# Patient Record
Sex: Female | Born: 1985 | Race: Black or African American | Hispanic: No | Smoking: Current every day smoker
Health system: Southern US, Community
[De-identification: ages and names within clinical notes are randomized; demographics above are authoritative.]

## PROBLEM LIST (undated history)

## (undated) ENCOUNTER — Inpatient Hospital Stay (HOSPITAL_COMMUNITY): Payer: Self-pay

## (undated) DIAGNOSIS — F329 Major depressive disorder, single episode, unspecified: Secondary | ICD-10-CM

## (undated) HISTORY — PX: NO PAST SURGERIES: SHX2092

---
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
## 2005-10-23 DIAGNOSIS — F32A Depression, unspecified: Secondary | ICD-10-CM

## 2005-10-23 HISTORY — DX: Depression, unspecified: F32.A

## 2008-09-02 ENCOUNTER — Emergency Department (HOSPITAL_COMMUNITY): Admission: EM | Admit: 2008-09-02 | Discharge: 2008-09-02 | Payer: Self-pay | Admitting: Emergency Medicine

## 2009-01-07 ENCOUNTER — Emergency Department (HOSPITAL_COMMUNITY): Admission: EM | Admit: 2009-01-07 | Discharge: 2009-01-07 | Payer: Self-pay | Admitting: Emergency Medicine

## 2009-11-12 ENCOUNTER — Emergency Department (HOSPITAL_COMMUNITY): Admission: EM | Admit: 2009-11-12 | Discharge: 2009-11-13 | Payer: Self-pay | Admitting: Emergency Medicine

## 2010-06-13 ENCOUNTER — Inpatient Hospital Stay (HOSPITAL_COMMUNITY)
Admission: AD | Admit: 2010-06-13 | Discharge: 2010-06-13 | Payer: Self-pay | Source: Home / Self Care | Admitting: Family Medicine

## 2010-08-09 ENCOUNTER — Emergency Department (HOSPITAL_COMMUNITY): Admission: EM | Admit: 2010-08-09 | Discharge: 2010-08-09 | Payer: Self-pay | Admitting: Emergency Medicine

## 2010-10-11 ENCOUNTER — Emergency Department (HOSPITAL_COMMUNITY)
Admission: EM | Admit: 2010-10-11 | Discharge: 2010-10-11 | Payer: Self-pay | Source: Home / Self Care | Admitting: Family Medicine

## 2011-01-02 LAB — POCT I-STAT, CHEM 8
BUN: 8 mg/dL (ref 6–23)
Calcium, Ion: 1.15 mmol/L (ref 1.12–1.32)
Chloride: 105 mEq/L (ref 96–112)
Creatinine, Ser: 0.9 mg/dL (ref 0.4–1.2)
Glucose, Bld: 75 mg/dL (ref 70–99)
TCO2: 26 mmol/L (ref 0–100)

## 2011-01-09 LAB — DIFFERENTIAL
Basophils Absolute: 0 10*3/uL (ref 0.0–0.1)
Basophils Relative: 0 % (ref 0–1)
Eosinophils Absolute: 0.1 10*3/uL (ref 0.0–0.7)
Monocytes Relative: 5 % (ref 3–12)
Neutrophils Relative %: 89 % — ABNORMAL HIGH (ref 43–77)

## 2011-01-09 LAB — BASIC METABOLIC PANEL
BUN: 12 mg/dL (ref 6–23)
CO2: 27 mEq/L (ref 19–32)
Calcium: 9.7 mg/dL (ref 8.4–10.5)
Chloride: 101 mEq/L (ref 96–112)
Creatinine, Ser: 0.87 mg/dL (ref 0.4–1.2)
Glucose, Bld: 101 mg/dL — ABNORMAL HIGH (ref 70–99)

## 2011-01-09 LAB — URINE MICROSCOPIC-ADD ON

## 2011-01-09 LAB — CBC
MCHC: 32.5 g/dL (ref 30.0–36.0)
MCV: 87.5 fL (ref 78.0–100.0)
Platelets: 163 10*3/uL (ref 150–400)
RBC: 5.16 MIL/uL — ABNORMAL HIGH (ref 3.87–5.11)
RDW: 14.6 % (ref 11.5–15.5)

## 2011-01-09 LAB — URINALYSIS, ROUTINE W REFLEX MICROSCOPIC
Glucose, UA: NEGATIVE mg/dL
Ketones, ur: NEGATIVE mg/dL
Leukocytes, UA: NEGATIVE
Protein, ur: 30 mg/dL — AB
Urobilinogen, UA: 0.2 mg/dL (ref 0.0–1.0)

## 2011-01-25 ENCOUNTER — Emergency Department (HOSPITAL_COMMUNITY)
Admission: EM | Admit: 2011-01-25 | Discharge: 2011-01-25 | Disposition: A | Discharge status: Home / Self Care | Payer: Self-pay | Attending: Emergency Medicine | Admitting: Emergency Medicine

## 2011-01-25 DIAGNOSIS — R45851 Suicidal ideations: Secondary | ICD-10-CM | POA: Insufficient documentation

## 2011-01-25 DIAGNOSIS — H5501 Congenital nystagmus: Secondary | ICD-10-CM | POA: Insufficient documentation

## 2011-01-25 DIAGNOSIS — H5316 Psychophysical visual disturbances: Secondary | ICD-10-CM | POA: Insufficient documentation

## 2011-01-25 DIAGNOSIS — F329 Major depressive disorder, single episode, unspecified: Secondary | ICD-10-CM

## 2011-01-25 LAB — URINE MICROSCOPIC-ADD ON

## 2011-01-25 LAB — RAPID URINE DRUG SCREEN, HOSP PERFORMED
Amphetamines: NOT DETECTED
Benzodiazepines: NOT DETECTED
Cocaine: NOT DETECTED
Tetrahydrocannabinol: POSITIVE — AB

## 2011-01-25 LAB — CBC
HCT: 38.8 % (ref 36.0–46.0)
MCV: 85.5 fL (ref 78.0–100.0)
RBC: 4.54 MIL/uL (ref 3.87–5.11)
WBC: 4.6 10*3/uL (ref 4.0–10.5)

## 2011-01-25 LAB — URINALYSIS, ROUTINE W REFLEX MICROSCOPIC: Bilirubin Urine: NEGATIVE

## 2011-01-25 LAB — COMPREHENSIVE METABOLIC PANEL
ALT: 11 U/L (ref 0–35)
AST: 15 U/L (ref 0–37)
CO2: 26 mEq/L (ref 19–32)
Calcium: 9.1 mg/dL (ref 8.4–10.5)
Creatinine, Ser: 0.8 mg/dL (ref 0.4–1.2)
GFR calc Af Amer: >60 mL/min (ref >60)
GFR calc non Af Amer: >60 mL/min (ref >60)
Sodium: 136 mEq/L (ref 135–145)
Total Protein: 6.9 g/dL (ref 6.0–8.3)

## 2011-01-25 LAB — DIFFERENTIAL
Eosinophils Relative: 3 % (ref 0–5)
Lymphocytes Relative: 41 % (ref 12–46)
Lymphs Abs: 1.9 10*3/uL (ref 0.7–4.0)
Neutrophils Relative %: 50 % (ref 43–77)

## 2011-01-25 LAB — ETHANOL: Alcohol, Ethyl (B): <5 mg/dL (ref 0–10)

## 2011-01-26 NOTE — Consult Note (Signed)
  NAMEAUBRIEE, Vicki Tran                ACCOUNT NO.:  [id number]  MEDICAL RECORD NO.:  [id number]           PATIENT TYPE:  E  LOCATION:  WLED                         FACILITY:  [facility]  PHYSICIAN:  Eulogio Ditch, MD DATE OF BIRTH:  June 09, 1986  DATE OF CONSULTATION:  01/25/2011 DATE OF DISCHARGE:                                CONSULTATION   FACILITY:  [facility].  REASON FOR CONSULTATION:  Depression and suicidal ideations.  HISTORY OF PRESENT ILLNESS:  This 25 year old African-American female who came to [facility] to get help for her depression.  The patient was seen with ACT team member, [name].  The patient told us that she is not suicidal, she do not want to kill her but she feels depressed and she came here by herself to get help in the outpatient setting for the depression.  The patient also is abusing alcohol and marijuana, and she wants counseling for that but in the outpatient setting.  The patient is very logical and goal directed during the interview, not internally preoccupied.  No flight of ideas present.  Denies hearing any voices.  SOCIAL HISTORY:  The patient lives with her friend.  She is not working, not going to school.  The patient also regularly in touch with the mother.  The patient is alert, awake, oriented x3.  Memory immediate, recent, and remote intact.  Fund of knowledge fair.  Attention and concentration good.  Abstraction ability good.  Insight and judgment intact.  DIAGNOSES:  AXIS I:  Depressive disorder, NOS; rule out major depressive disorder, recurrent type; alcohol and marijuana abuse/dependence. AXIS II:  Deferred. AXIS III:  No active medical issue. AXIS IV:  Chronic substance abuse. AXIS V:  50 to 60.  RECOMMENDATIONS:  The patient at this time does not meet criteria to be admitted on IVC to the inpatient.  The patient has not having any active suicidal ideation, she reported that she has suicidal ideation in the past.  The  patient never been admitted to psych hospital in the past. No history of recent suicide attempt, she has one suicide attempt by overdose in her teens but not recently in last few years that she tried to kill herself.  The patient was given an option to be admitted to behavioral health, but patient is adamant about following outpatient setting and do not want to be admitted in the hospital.  As patient not meeting criteria to be admitted in the hospital IVC, patient can be discharged, to follow up in the outpatient setting.     Eulogio Ditch, MD     SA/MEDQ  D:  01/25/2011  T:  01/25/2011  Job:  [id number]  Electronically Signed by Eulogio Ditch  on 01/26/2011 08:42:27 AM

## 2011-05-01 ENCOUNTER — Emergency Department (HOSPITAL_COMMUNITY)
Admission: EM | Admit: 2011-05-01 | Discharge: 2011-05-01 | Disposition: A | Discharge status: Home / Self Care | Payer: Self-pay | Attending: Emergency Medicine | Admitting: Emergency Medicine

## 2011-05-01 DIAGNOSIS — R1084 Generalized abdominal pain: Secondary | ICD-10-CM | POA: Insufficient documentation

## 2011-05-01 DIAGNOSIS — F1210 Cannabis abuse, uncomplicated: Secondary | ICD-10-CM | POA: Insufficient documentation

## 2011-05-01 DIAGNOSIS — Z0000 Encounter for general adult medical examination without abnormal findings: Secondary | ICD-10-CM | POA: Insufficient documentation

## 2011-05-01 DIAGNOSIS — R51 Headache: Secondary | ICD-10-CM | POA: Insufficient documentation

## 2011-05-01 LAB — POCT PREGNANCY, URINE: Preg Test, Ur: NEGATIVE

## 2011-06-01 ENCOUNTER — Ambulatory Visit: Payer: Self-pay | Admitting: Obstetrics and Gynecology

## 2012-02-05 ENCOUNTER — Encounter (HOSPITAL_COMMUNITY): Payer: Self-pay

## 2012-02-05 ENCOUNTER — Emergency Department (HOSPITAL_COMMUNITY): Payer: Self-pay

## 2012-02-05 ENCOUNTER — Emergency Department (HOSPITAL_COMMUNITY)
Admission: EM | Admit: 2012-02-05 | Discharge: 2012-02-05 | Disposition: A | Discharge status: Home / Self Care | Payer: Self-pay | Attending: Emergency Medicine | Admitting: Emergency Medicine

## 2012-02-05 DIAGNOSIS — R112 Nausea with vomiting, unspecified: Secondary | ICD-10-CM | POA: Insufficient documentation

## 2012-02-05 LAB — DIFFERENTIAL
Basophils Absolute: 0 10*3/uL (ref 0.0–0.1)
Basophils Relative: 0 % (ref 0–1)
Eosinophils Absolute: 0.1 10*3/uL (ref 0.0–0.7)
Eosinophils Relative: 1 % (ref 0–5)
Lymphocytes Relative: 4 % — ABNORMAL LOW (ref 12–46)
Lymphs Abs: 0.4 10*3/uL — ABNORMAL LOW (ref 0.7–4.0)
Monocytes Absolute: 0.4 10*3/uL (ref 0.1–1.0)
Monocytes Relative: 5 % (ref 3–12)
Neutro Abs: 8.4 10*3/uL — ABNORMAL HIGH (ref 1.7–7.7)
Neutrophils Relative %: 91 % — ABNORMAL HIGH (ref 43–77)

## 2012-02-05 LAB — URINALYSIS, ROUTINE W REFLEX MICROSCOPIC
Bilirubin Urine: NEGATIVE
Glucose, UA: NEGATIVE mg/dL
Hgb urine dipstick: NEGATIVE
Ketones, ur: >80 mg/dL — AB
Leukocytes, UA: NEGATIVE
Nitrite: NEGATIVE
Protein, ur: NEGATIVE mg/dL
Specific Gravity, Urine: 1.024 (ref 1.005–1.030)
Urobilinogen, UA: 0.2 mg/dL (ref 0.0–1.0)
pH: 6 (ref 5.0–8.0)

## 2012-02-05 LAB — COMPREHENSIVE METABOLIC PANEL
ALT: 14 U/L (ref 0–35)
AST: 35 U/L (ref 0–37)
Albumin: 3.8 g/dL (ref 3.5–5.2)
Alkaline Phosphatase: 48 U/L (ref 39–117)
BUN: 11 mg/dL (ref 6–23)
CO2: 23 mEq/L (ref 19–32)
Calcium: 8.9 mg/dL (ref 8.4–10.5)
Chloride: 102 mEq/L (ref 96–112)
Creatinine, Ser: 0.73 mg/dL (ref 0.50–1.10)
GFR calc Af Amer: >90 mL/min (ref >90)
GFR calc non Af Amer: >90 mL/min (ref >90)
Glucose, Bld: 71 mg/dL (ref 70–99)
Potassium: 4.1 mEq/L (ref 3.5–5.1)
Sodium: 136 mEq/L (ref 135–145)
Total Bilirubin: 0.5 mg/dL (ref 0.3–1.2)
Total Protein: 6.9 g/dL (ref 6.0–8.3)

## 2012-02-05 LAB — POCT I-STAT, CHEM 8
BUN: 11 mg/dL (ref 6–23)
Chloride: 106 mEq/L (ref 96–112)
Creatinine, Ser: 0.9 mg/dL (ref 0.50–1.10)
Glucose, Bld: 74 mg/dL (ref 70–99)
Potassium: 4.3 mEq/L (ref 3.5–5.1)

## 2012-02-05 LAB — CBC
HCT: 39.6 % (ref 36.0–46.0)
Hemoglobin: 13.3 g/dL (ref 12.0–15.0)
MCH: 28.1 pg (ref 26.0–34.0)
MCHC: 33.6 g/dL (ref 30.0–36.0)
MCV: 83.7 fL (ref 78.0–100.0)
Platelets: 191 10*3/uL (ref 150–400)
RBC: 4.73 MIL/uL (ref 3.87–5.11)
RDW: 15.4 % (ref 11.5–15.5)
WBC: 9.2 10*3/uL (ref 4.0–10.5)

## 2012-02-05 LAB — PREGNANCY, URINE: Preg Test, Ur: NEGATIVE

## 2012-02-05 LAB — LIPASE, BLOOD: Lipase: 15 U/L (ref 11–59)

## 2012-02-05 MED ORDER — SODIUM CHLORIDE 0.9 % IV BOLUS (SEPSIS): 1000.0000 mL | Freq: Once | INTRAVENOUS | Status: DC

## 2012-02-05 MED ORDER — ONDANSETRON HCL 4 MG/2ML IJ SOLN: 4.0000 mg | Freq: Once | INTRAMUSCULAR | Status: DC

## 2012-02-05 MED ORDER — SODIUM CHLORIDE 0.9 % IV BOLUS (SEPSIS)
2000.0000 mL | Freq: Once | INTRAVENOUS | Status: AC
  Administered 2012-02-05: 2000 mL via INTRAVENOUS

## 2012-02-05 MED ORDER — PROMETHAZINE HCL 25 MG PO TABS: 25.0000 mg | ORAL_TABLET | Freq: Three times a day (TID) | ORAL | Status: DC | PRN

## 2012-02-05 MED ORDER — PROMETHAZINE HCL 25 MG/ML IJ SOLN
12.5000 mg | Freq: Once | INTRAMUSCULAR | Status: AC
  Administered 2012-02-05: 21:00:00 via INTRAVENOUS
  Filled 2012-02-05: qty 1

## 2012-02-05 MED ORDER — ONDANSETRON HCL 4 MG/2ML IJ SOLN
4.0000 mg | Freq: Once | INTRAMUSCULAR | Status: AC
  Administered 2012-02-05: 4 mg via INTRAVENOUS
  Filled 2012-02-05: qty 2

## 2012-02-05 MED ORDER — MORPHINE SULFATE 4 MG/ML IJ SOLN
4.0000 mg | Freq: Once | INTRAMUSCULAR | Status: AC
  Administered 2012-02-05: 4 mg via INTRAVENOUS
  Filled 2012-02-05: qty 1

## 2012-02-05 MED ORDER — HYDROMORPHONE HCL PF 1 MG/ML IJ SOLN: 1.0000 mg | Freq: Once | INTRAMUSCULAR | Status: DC

## 2012-02-05 NOTE — ED Notes (Signed)
Patient reports that she has nausea, vomiting since heavy etoh use on Saturday and drank some yesterday.  Patient vomiting on arrival

## 2012-02-05 NOTE — ED Provider Notes (Signed)
History     CSN: [id number]  Arrival date & time 02/05/12  1352   First MD Initiated Contact with Patient 02/05/12 1516      Chief Complaint  Patient presents with  . Nausea    HPI The patient presents to the ER after having too much to drink Saturday night and Sunday. The patient states that she drank heavily on Saturday night and then had a large [employer] Tea. The patient denies any chest pain, SOB, fever, diarrhea, visual changes, back pain, or blood in her vomit. The patient states that she could not keep anything down last night and today. She did not try any treatment for her condition at home. She states that movement made her feel more nauseated.       History reviewed. No pertinent past medical history.  History reviewed. No pertinent past surgical history.  No family history on file.  History  Substance Use Topics  . Smoking status: Not on file  . Smokeless tobacco: Not on file  . Alcohol Use: Yes    OB History    Grav Para Term Preterm Abortions TAB SAB Ect Mult Living                  Review of Systems All pertinent positives and negatives reviewed in the history of present illness  Allergies  Review of patient's allergies indicates no known allergies.  Home Medications   Current Outpatient Rx  Name Route Sig Dispense Refill  . PROMETHAZINE HCL 25 MG PO TABS Oral Take 1 tablet (25 mg total) by mouth every 8 (eight) hours as needed for nausea. 10 tablet 0    BP 90/47  Pulse 93  Temp(Src) 98.4 F (36.9 C) (Oral)  Resp 16  SpO2 100%  Physical Exam  Constitutional: She is oriented to person, place, and time. She appears well-developed and well-nourished. No distress.  HENT:  Head: Normocephalic and atraumatic.  Mouth/Throat: Oropharynx is clear and moist. No oropharyngeal exudate.  Eyes: Pupils are equal, round, and reactive to light.  Neck: Normal range of motion. Neck supple.  Cardiovascular: Normal rate, regular rhythm and normal  heart sounds.  Exam reveals no gallop and no friction rub.   No murmur heard. Pulmonary/Chest: Effort normal and breath sounds normal. No respiratory distress. She has no wheezes. She has no rales.  Abdominal: Soft. Bowel sounds are normal. She exhibits no distension. There is no tenderness. There is no rebound and no guarding.  Neurological: She is alert and oriented to person, place, and time.  Skin: Skin is warm and dry. No rash noted.    ED Course  Procedures (including critical care time)  Labs Reviewed  DIFFERENTIAL - Abnormal; Notable for the following:    Neutrophils Relative 91 (*)    Neutro Abs 8.4 (*)    Lymphocytes Relative 4 (*)    Lymphs Abs 0.4 (*)    All other components within normal limits  URINALYSIS, ROUTINE W REFLEX MICROSCOPIC - Abnormal; Notable for the following:    Ketones, ur >80 (*)    All other components within normal limits  POCT I-STAT, CHEM 8 - Abnormal; Notable for the following:    Hemoglobin 15.6 (*)    All other components within normal limits  COMPREHENSIVE METABOLIC PANEL  CBC  LIPASE, BLOOD  PREGNANCY, URINE   US Abdomen Complete  02/05/2012  *RADIOLOGY REPORT*  Clinical Data:  Right upper quadrant pain.  COMPLETE ABDOMINAL ULTRASOUND  Comparison:  None  Findings:  Gallbladder:  The gallbladder has a normal appearance.  No gallbladder wall thickening or stones.  No sonographic Murphy's sign.  Common bile duct:  3.0 mm, within normal limits.  Liver:  Small hyperechoic lesion is 1.0 x 0.7 x 0.8 mm in the right hepatic lobe, consistent with hemangioma.  No other focal lesions are identified.  IVC:  Appears normal.  Pancreas:  No focal abnormality seen.  Spleen:  The spleen is normal in appearance and measures 4 cm in length.  Right Kidney:  The right kidney is normal in appearance, 11.4 cm in length.  Left Kidney:  The left kidney is normal in appearance, 10.8 cm in length.  Abdominal aorta:  The abdominal aorta is 1.6 cm maximum diameter.   IMPRESSION:  1.  Small right hepatic lobe hemangioma. 2. No evidence for acute abdominal abnormality.  Original Report Authenticated By: Patterson Hammersmith, M.D.     1. Nausea & vomiting     The patient has received two liters of fluids and is feeling better. The patient most likely has dehydration from her excessive alcohol consumption that was followed by vomiting.The patient is advised to return here for any worsening in her condition. She is advised to increase her fluids with things such as Gatorade.   MDM  MDM Reviewed: nursing note and vitals Interpretation: labs            Carlyle Dolly, PA-C 02/06/12 1052

## 2012-02-05 NOTE — Discharge Instructions (Signed)
REturn here as needed. Slowly increase your fluids.

## 2012-02-05 NOTE — ED Notes (Signed)
Pt sts "I drank too much, I've had alcohol poisoning before and this feels the same." Reports drinking a fifth of vodka Saturday night and one LIT yesterday. My bones ache, my back hurts, my head hurts, I just feel bad." Reports "spitting up" multiple times since symptoms started at 0400 today. Cannot state what she means by "spitting up." Just says she has done it >10x today. Also reports diarrhea x5 today.

## 2012-02-08 NOTE — ED Provider Notes (Signed)
Medical screening examination/treatment/procedure(s) were performed by non-physician practitioner and as supervising physician I was immediately available for consultation/collaboration.  Margaruite Top R. Andreka Stucki, MD 02/08/12 0702 

## 2012-03-10 ENCOUNTER — Emergency Department (HOSPITAL_COMMUNITY)
Admission: EM | Admit: 2012-03-10 | Discharge: 2012-03-10 | Disposition: A | Discharge status: Home / Self Care | Payer: Self-pay | Attending: Emergency Medicine | Admitting: Emergency Medicine

## 2012-03-10 ENCOUNTER — Encounter (HOSPITAL_COMMUNITY): Payer: Self-pay | Admitting: Physical Medicine and Rehabilitation

## 2012-03-10 DIAGNOSIS — R112 Nausea with vomiting, unspecified: Secondary | ICD-10-CM | POA: Insufficient documentation

## 2012-03-10 DIAGNOSIS — A5901 Trichomonal vulvovaginitis: Secondary | ICD-10-CM | POA: Insufficient documentation

## 2012-03-10 DIAGNOSIS — N72 Inflammatory disease of cervix uteri: Secondary | ICD-10-CM

## 2012-03-10 DIAGNOSIS — R1033 Periumbilical pain: Secondary | ICD-10-CM | POA: Insufficient documentation

## 2012-03-10 DIAGNOSIS — N760 Acute vaginitis: Secondary | ICD-10-CM | POA: Insufficient documentation

## 2012-03-10 LAB — COMPREHENSIVE METABOLIC PANEL
ALT: 7 U/L (ref 0–35)
Albumin: 3.5 g/dL (ref 3.5–5.2)
Alkaline Phosphatase: 39 U/L (ref 39–117)
BUN: 7 mg/dL (ref 6–23)
Chloride: 104 mEq/L (ref 96–112)
Glucose, Bld: 74 mg/dL (ref 70–99)
Potassium: 3.7 mEq/L (ref 3.5–5.1)
Sodium: 138 mEq/L (ref 135–145)
Total Bilirubin: 0.2 mg/dL — ABNORMAL LOW (ref 0.3–1.2)

## 2012-03-10 LAB — WET PREP, GENITAL

## 2012-03-10 LAB — URINALYSIS, ROUTINE W REFLEX MICROSCOPIC
Bilirubin Urine: NEGATIVE
Hgb urine dipstick: NEGATIVE
Nitrite: NEGATIVE
Specific Gravity, Urine: 1.005 (ref 1.005–1.030)
pH: 7.5 (ref 5.0–8.0)

## 2012-03-10 LAB — HCG, QUANTITATIVE, PREGNANCY: hCG, Beta Chain, Quant, S: <1 m[IU]/mL (ref <5)

## 2012-03-10 LAB — POCT PREGNANCY, URINE: Preg Test, Ur: NEGATIVE

## 2012-03-10 MED ORDER — ONDANSETRON 8 MG PO TBDP: 8.0000 mg | ORAL_TABLET | Freq: Three times a day (TID) | ORAL | Status: AC | PRN

## 2012-03-10 MED ORDER — AZITHROMYCIN 250 MG PO TABS
1000.0000 mg | ORAL_TABLET | Freq: Once | ORAL | Status: AC
  Administered 2012-03-10: 1000 mg via ORAL
  Filled 2012-03-10: qty 4

## 2012-03-10 MED ORDER — METRONIDAZOLE 500 MG PO TABS: 500.0000 mg | ORAL_TABLET | Freq: Two times a day (BID) | ORAL | Status: DC

## 2012-03-10 MED ORDER — SODIUM CHLORIDE 0.9 % IV BOLUS (SEPSIS)
1000.0000 mL | Freq: Once | INTRAVENOUS | Status: AC
  Administered 2012-03-10: 1000 mL via INTRAVENOUS

## 2012-03-10 MED ORDER — HYDROCODONE-ACETAMINOPHEN 5-325 MG PO TABS: 1.0000 | ORAL_TABLET | Freq: Four times a day (QID) | ORAL | Status: AC | PRN

## 2012-03-10 MED ORDER — METRONIDAZOLE 500 MG PO TABS
2000.0000 mg | ORAL_TABLET | Freq: Once | ORAL | Status: AC
  Administered 2012-03-10: 2000 mg via ORAL
  Filled 2012-03-10: qty 4

## 2012-03-10 MED ORDER — ONDANSETRON HCL 4 MG/2ML IJ SOLN
4.0000 mg | Freq: Once | INTRAMUSCULAR | Status: AC
  Administered 2012-03-10: 4 mg via INTRAVENOUS
  Filled 2012-03-10: qty 2

## 2012-03-10 MED ORDER — FENTANYL CITRATE 0.05 MG/ML IJ SOLN
50.0000 ug | Freq: Once | INTRAMUSCULAR | Status: AC
  Administered 2012-03-10: 50 ug via INTRAVENOUS
  Filled 2012-03-10: qty 2

## 2012-03-10 MED ORDER — CEFTRIAXONE SODIUM 250 MG IJ SOLR
250.0000 mg | Freq: Once | INTRAMUSCULAR | Status: AC
  Administered 2012-03-10: 250 mg via INTRAMUSCULAR
  Filled 2012-03-10: qty 250

## 2012-03-10 NOTE — ED Provider Notes (Signed)
History     CSN: [id number]  Arrival date & time 03/10/12  1235   First MD Initiated Contact with Patient 03/10/12 1246     1:27 PM HPI  Patient reports for 2-3 days has had abdominal pain. When asked where her pain is she points to her periumbilical area. Describes pain as constant and an associated with nausea, vomiting, hematuria, urinary retention. Denies abdominal surgeries. Denies abnormal food or sick contacts. Denies fever, back pain, chest pain, shortness of breath. Patient is a 26 y.o. female presenting with abdominal pain. The history is provided by the patient.  Abdominal Pain The primary symptoms of the illness include abdominal pain, nausea, vomiting, diarrhea and dysuria. The primary symptoms of the illness do not include fever, fatigue, shortness of breath, hematemesis, hematochezia, vaginal discharge or vaginal bleeding. The current episode started more than 2 days ago. The onset of the illness was gradual. The problem has been gradually worsening.  The pain came on gradually. The abdominal pain is located in the periumbilical region. The abdominal pain does not radiate. The severity of the abdominal pain is 9/10. Exacerbated by: palpation.  The dysuria is associated with hematuria. The dysuria is not associated with frequency, urgency or vaginal pain.  The patient states that she believes she is currently not pregnant. The patient has not had a change in bowel habit. Additional symptoms associated with the illness include hematuria. Symptoms associated with the illness do not include chills, heartburn, constipation, urgency, frequency or back pain.    No past medical history on file.  No past surgical history on file.  No family history on file.  History  Substance Use Topics  . Smoking status: Never Smoker   . Smokeless tobacco: Not on file  . Alcohol Use: Yes    OB History    Grav Para Term Preterm Abortions TAB SAB Ect Mult Living                  Review of  Systems  Constitutional: Negative for fever, chills and fatigue.  Respiratory: Negative for shortness of breath.   Cardiovascular: Negative for chest pain.  Gastrointestinal: Positive for nausea, vomiting, abdominal pain and diarrhea. Negative for heartburn, constipation, hematochezia and hematemesis.  Genitourinary: Positive for dysuria and hematuria. Negative for urgency, frequency, flank pain, vaginal bleeding, vaginal discharge and vaginal pain.  Musculoskeletal: Negative for back pain.  All other systems reviewed and are negative.    Allergies  Review of patient's allergies indicates no known allergies.  Home Medications   Current Outpatient Rx  Name Route Sig Dispense Refill  . OVER THE COUNTER MEDICATION Oral Take 1 tablet by mouth at bedtime as needed. For sleep  otc sleep aid    . PROMETHAZINE HCL 25 MG PO TABS Oral Take 1 tablet (25 mg total) by mouth every 8 (eight) hours as needed for nausea. 10 tablet 0    BP 113/62  Pulse 112  Temp(Src) 98.1 F (36.7 C) (Oral)  Resp 16  SpO2 97%  Physical Exam  Vitals reviewed. Constitutional: She is oriented to person, place, and time. Vital signs are normal. She appears well-developed and well-nourished.  HENT:  Head: Normocephalic and atraumatic.  Eyes: Conjunctivae are normal. Pupils are equal, round, and reactive to light.  Neck: Normal range of motion. Neck supple.  Cardiovascular: Normal rate, regular rhythm and normal heart sounds.  Exam reveals no friction rub.   No murmur heard. Pulmonary/Chest: Effort normal and breath sounds normal. She has no  wheezes. She has no rhonchi. She has no rales. She exhibits no tenderness.  Abdominal: Soft. Normal appearance and bowel sounds are normal. She exhibits no distension and no mass. There is no hepatosplenomegaly. There is no tenderness (nontender with distraction). There is no rigidity, no rebound, no CVA tenderness and negative Murphy's sign. No hernia.  Genitourinary: Vagina  normal and uterus normal. There is no tenderness or lesion on the right labia. There is no tenderness or lesion on the left labia. Uterus is not tender. Cervix exhibits no motion tenderness and no discharge. Right adnexum displays no tenderness and no fullness. Left adnexum displays no tenderness and no fullness. No tenderness around the vagina. No vaginal discharge found.  Musculoskeletal: Normal range of motion.  Neurological: She is alert and oriented to person, place, and time. Coordination normal.  Skin: Skin is warm and dry. No rash noted. No erythema. No pallor.    ED Course  Procedures  Results for orders placed during the hospital encounter of 03/10/12  URINALYSIS, ROUTINE W REFLEX MICROSCOPIC      Component Value Range   Color, Urine YELLOW  YELLOW    APPearance CLEAR  CLEAR    Specific Gravity, Urine 1.005  1.005 - 1.030    pH 7.5  5.0 - 8.0    Glucose, UA NEGATIVE  NEGATIVE (mg/dL)   Hgb urine dipstick NEGATIVE  NEGATIVE    Bilirubin Urine NEGATIVE  NEGATIVE    Ketones, ur NEGATIVE  NEGATIVE (mg/dL)   Protein, ur NEGATIVE  NEGATIVE (mg/dL)   Urobilinogen, UA 0.2  0.0 - 1.0 (mg/dL)   Nitrite NEGATIVE  NEGATIVE    Leukocytes, UA NEGATIVE  NEGATIVE   COMPREHENSIVE METABOLIC PANEL      Component Value Range   Sodium 138  135 - 145 (mEq/L)   Potassium 3.7  3.5 - 5.1 (mEq/L)   Chloride 104  96 - 112 (mEq/L)   CO2 25  19 - 32 (mEq/L)   Glucose, Bld 74  70 - 99 (mg/dL)   BUN 7  6 - 23 (mg/dL)   Creatinine, Ser 4.09  0.50 - 1.10 (mg/dL)   Calcium 9.3  8.4 - 81.1 (mg/dL)   Total Protein 6.4  6.0 - 8.3 (g/dL)   Albumin 3.5  3.5 - 5.2 (g/dL)   AST 14  0 - 37 (U/L)   ALT 7  0 - 35 (U/L)   Alkaline Phosphatase 39  39 - 117 (U/L)   Total Bilirubin 0.2 (*) 0.3 - 1.2 (mg/dL)   GFR calc non Af Amer >90  >90 (mL/min)   GFR calc Af Amer >90  >90 (mL/min)  HCG, QUANTITATIVE, PREGNANCY      Component Value Range   hCG, Beta Chain, Quant, S <1  <5 (mIU/mL)  LIPASE, BLOOD       Component Value Range   Lipase 28  11 - 59 (U/L)  WET PREP, GENITAL      Component Value Range   Yeast Wet Prep HPF POC NONE SEEN  NONE SEEN    Trich, Wet Prep FEW (*) NONE SEEN    Clue Cells Wet Prep HPF POC MODERATE (*) NONE SEEN    WBC, Wet Prep HPF POC FEW (*) NONE SEEN   POCT PREGNANCY, URINE      Component Value Range   Preg Test, Ur NEGATIVE  NEGATIVE    No results found.   MDM    Patient's exam indicated likely pain was pelvic.  patient has trichomonas. Patient  likely having a PIC. Patient's pain resolved after medication. Advised that if symptoms worsen, return to ED. Advise patient against sexual intercourse for at least 7 days. Advised patient that she is legally obligated to do all Partner(s) of diagnosis of STD. Patient voices understanding and is ready for discharge   Thomasene Lot, Cordelia Poche 03/10/12 1540

## 2012-03-10 NOTE — ED Notes (Signed)
Pt states she is unable to void at the time. 

## 2012-03-10 NOTE — ED Notes (Signed)
Pt presents to department via [facility] for evaluation of RUQ abdominal pain. Ongoing x3 days. Also states nausea/vomiting. 10/10 pain upon arrival to ED. Tearful in triage. Pt conscious alert and oriented x4. Pt states scant amounts of blood in urine. No signs of acute distress at the time.

## 2012-03-10 NOTE — ED Provider Notes (Signed)
Medical screening examination/treatment/procedure(s) were performed by non-physician practitioner and as supervising physician I was immediately available for consultation/collaboration.  Shelda Jakes, MD 03/10/12 [phone]

## 2012-03-10 NOTE — Discharge Instructions (Signed)
Cervicitis   Cervicitis is a soreness and swelling (inflammation) of the cervix. Your cervix is located at the bottom of your uterus which opens up to the vagina.   CAUSES   Sexually transmitted infections (STIs).   Allergic reaction.   Medicines or birth control devices that are put in the vagina.   Injury to the cervix.   Bacterial infections.   SYMPTOMS   There may be no symptoms. If symptoms occur, they may include:   Grey, white, yellow, or bad smelling vaginal discharge.   Pain or itching of the area outside the vagina.   Painful sexual intercourse.   Lower abdominal or lower back pain, especially during intercourse.   Frequent urination.   Abnormal vaginal bleeding between periods, after sexual intercourse, or after menopause.   Pressure or a heavy feeling in the pelvis.   DIAGNOSIS   Diagnosis is made after a pelvic exam. Other tests may include:   Examination of any discharge under a microscope (wet prep).   A Pap test.   TREATMENT   Treatment will depend on the cause of cervicitis. If it is caused by an STI, both you and your partner will need to be treated. Antibiotic medicines will be given.   HOME CARE INSTRUCTIONS   Do not have sexual intercourse until your caregiver says it is okay.   Do not have sexual intercourse until your partner has been treated if your cervicitis is caused by an STI.   Take your antibiotics as directed. Finish them even if you start to feel better.   SEEK IMMEDIATE MEDICAL CARE IF:   Your symptoms come back.   You have a fever.   You experience any problems that may be related to the medicine you are taking.   MAKE SURE YOU:   Understand these instructions.   Will watch your condition.   Will get help right away if you are not doing well or get worse.   Document Released: 10/09/2005 Document Revised: 09/28/2011 Document Reviewed: 05/08/2011   [employer] Patient Information 2012 [employer]

## 2012-03-10 NOTE — ED Notes (Signed)
Patient is unable to void at this time 

## 2012-03-10 NOTE — ED Notes (Signed)
Pelvic Cart set up in room  

## 2012-03-12 LAB — GC/CHLAMYDIA PROBE AMP, GENITAL
Chlamydia, DNA Probe: NEGATIVE
GC Probe Amp, Genital: NEGATIVE

## 2013-08-12 ENCOUNTER — Encounter (HOSPITAL_COMMUNITY): Payer: Self-pay | Admitting: Emergency Medicine

## 2013-08-12 ENCOUNTER — Emergency Department (INDEPENDENT_AMBULATORY_CARE_PROVIDER_SITE_OTHER)
Admission: EM | Admit: 2013-08-12 | Discharge: 2013-08-12 | Disposition: A | Discharge status: Home / Self Care | Payer: Self-pay | Source: Home / Self Care | Attending: Family Medicine | Admitting: Family Medicine

## 2013-08-12 ENCOUNTER — Other Ambulatory Visit (HOSPITAL_COMMUNITY)
Admission: RE | Admit: 2013-08-12 | Discharge: 2013-08-12 | Disposition: A | Discharge status: Home / Self Care | Payer: Self-pay | Source: Ambulatory Visit | Attending: Family Medicine | Admitting: Family Medicine

## 2013-08-12 DIAGNOSIS — N760 Acute vaginitis: Secondary | ICD-10-CM | POA: Insufficient documentation

## 2013-08-12 DIAGNOSIS — Z113 Encounter for screening for infections with a predominantly sexual mode of transmission: Secondary | ICD-10-CM | POA: Insufficient documentation

## 2013-08-12 DIAGNOSIS — N926 Irregular menstruation, unspecified: Secondary | ICD-10-CM

## 2013-08-12 LAB — CBC
MCH: 28.5 pg (ref 26.0–34.0)
MCV: 84.9 fL (ref 78.0–100.0)
Platelets: 157 10*3/uL (ref 150–400)
RDW: 14.1 % (ref 11.5–15.5)
WBC: 3.7 10*3/uL — ABNORMAL LOW (ref 4.0–10.5)

## 2013-08-12 LAB — POCT URINALYSIS DIP (DEVICE)
Ketones, ur: NEGATIVE mg/dL
Leukocytes, UA: NEGATIVE
Nitrite: NEGATIVE
Protein, ur: NEGATIVE mg/dL
Urobilinogen, UA: 0.2 mg/dL (ref 0.0–1.0)
pH: 7 (ref 5.0–8.0)

## 2013-08-12 LAB — POCT I-STAT, CHEM 8
Calcium, Ion: 1.25 mmol/L — ABNORMAL HIGH (ref 1.12–1.23)
Chloride: 107 mEq/L (ref 96–112)
Creatinine, Ser: 0.9 mg/dL (ref 0.50–1.10)
HCT: 42 % (ref 36.0–46.0)
Hemoglobin: 14.3 g/dL (ref 12.0–15.0)
Sodium: 141 mEq/L (ref 135–145)
TCO2: 24 mmol/L (ref 0–100)

## 2013-08-12 MED ORDER — SODIUM CHLORIDE 0.9 % IV BOLUS (SEPSIS)
1000.0000 mL | Freq: Once | INTRAVENOUS | Status: AC
  Administered 2013-08-12: 1000 mL via INTRAVENOUS

## 2013-08-12 NOTE — ED Notes (Signed)
C/o vaginal bleeding and abd pain.  Patient states she has had a regular cycle but she is still spotting and having front abd pain for three weeks now.  No treatment tried

## 2013-08-12 NOTE — ED Provider Notes (Signed)
Vicki Tran is a 27 y.o. female who presents to Urgent Care today for vaginal bleeding lightheadedness and dizziness. Patient has had off-and-on vaginal bleeding now for 3 weeks. She last had bleeding Sunday. She notes feeling weak lightheaded and dizzy. She denies any syncope chest pains or palpitations. He denies any vaginal discharge or dysuria. She notes some abdominal cramping and pain. Patient is no longer bleeding   History reviewed. No pertinent past medical history. History  Substance Use Topics  . Smoking status: Never Smoker   . Smokeless tobacco: Not on file  . Alcohol Use: Yes   ROS as above Medications reviewed. No current facility-administered medications for this encounter.   No current outpatient prescriptions on file.    Exam:  BP 104/44  Pulse 101  Temp(Src) 98.6 F (37 C) (Oral)  Resp 16  SpO2 97%  LMP 07/21/2013 Filed Vitals:   08/12/13 1023 08/12/13 1102 08/12/13 1103 08/12/13 1103  BP: 100/69 102/56 100/49 104/44  Pulse: 94 80 86 101  Temp: 98.6 F (37 C)     TempSrc: Oral     Resp: 16     SpO2: 97%      Gen: Well NAD HEENT: EOMI,  MMM Lungs: CTABL Nl WOB Heart: RRR no MRG Abd: NABS, NT, ND Exts: Non edematous BL  LE, warm and well perfused.  GYN: Normal external genitalia. Vaginal canal with very thin white discharge. Cervix is normal appearing. No bleeding. No cervical motion tenderness. No adnexal masses.  Patient was given a 1 L normal saline bolus and a considerable improvement in symptoms.  Results for orders placed during the hospital encounter of 08/12/13 (from the past 24 hour(s))  POCT URINALYSIS DIP (DEVICE)     Status: Abnormal   Collection Time    08/12/13 10:38 AM      Result Value Range   Glucose, UA NEGATIVE  NEGATIVE mg/dL   Bilirubin Urine NEGATIVE  NEGATIVE   Ketones, ur NEGATIVE  NEGATIVE mg/dL   Specific Gravity, Urine 1.015  1.005 - 1.030   Hgb urine dipstick TRACE (*) NEGATIVE   pH 7.0  5.0 - 8.0   Protein, ur  NEGATIVE  NEGATIVE mg/dL   Urobilinogen, UA 0.2  0.0 - 1.0 mg/dL   Nitrite NEGATIVE  NEGATIVE   Leukocytes, UA NEGATIVE  NEGATIVE  POCT PREGNANCY, URINE     Status: None   Collection Time    08/12/13 10:43 AM      Result Value Range   Preg Test, Ur NEGATIVE  NEGATIVE  CBC     Status: Abnormal   Collection Time    08/12/13 11:00 AM      Result Value Range   WBC 3.7 (*) 4.0 - 10.5 K/uL   RBC 4.31  3.87 - 5.11 MIL/uL   Hemoglobin 12.3  12.0 - 15.0 g/dL   HCT 16.1  09.6 - 04.5 %   MCV 84.9  78.0 - 100.0 fL   MCH 28.5  26.0 - 34.0 pg   MCHC 33.6  30.0 - 36.0 g/dL   RDW 40.9  81.1 - 91.4 %   Platelets 157  150 - 400 K/uL  POCT I-STAT, CHEM 8     Status: Abnormal   Collection Time    08/12/13 11:15 AM      Result Value Range   Sodium 141  135 - 145 mEq/L   Potassium 3.9  3.5 - 5.1 mEq/L   Chloride 107  96 - 112 mEq/L   BUN  6  6 - 23 mg/dL   Creatinine, Ser 1.61  0.50 - 1.10 mg/dL   Glucose, Bld 75  70 - 99 mg/dL   Calcium, Ion 0.96 (*) 1.12 - 1.23 mmol/L   TCO2 24  0 - 100 mmol/L   Hemoglobin 14.3  12.0 - 15.0 g/dL   HCT 04.5  40.9 - 81.1 %   No results found.  Assessment and Plan: 27 y.o. female with irregular menstrual bleeding. Unclear etiology. Patient was somewhat orthostatic as well which was relieved with IV bolus. She does not have evidence of anemia or significant electrolyte abnormality. I offered oral contraceptives to regulate her cycle however the patient declined. Plan to followup with OB/GYN soon. Return sooner if worsening Discussed warning signs or symptoms. Please see discharge instructions. Patient expresses understanding.       Rodolph Bong, MD 08/12/13 [phone]

## 2013-08-13 NOTE — ED Notes (Signed)
GC/Chlamydia neg., Affirm: Candida and Trich neg., Gardnerella pos.  Message sent to Dr. Denyse Amass. Vassie Moselle 08/13/2013

## 2013-08-14 ENCOUNTER — Telehealth (HOSPITAL_COMMUNITY): Payer: Self-pay | Admitting: Family Medicine

## 2013-08-14 MED ORDER — METRONIDAZOLE 500 MG PO TABS: 500.0000 mg | ORAL_TABLET | Freq: Two times a day (BID) | ORAL | Status: DC

## 2013-08-14 NOTE — ED Notes (Signed)
Dr. Denyse Amass e-prescribed Flagyl to pt.'s [facility] on [address]  I called pt.'s number and it rings once and goes blank-unable to leave a message. I called pt.'s contact-mother and asked her to have pt. call me. Call 1. Vassie Moselle 08/14/2013

## 2013-08-14 NOTE — Telephone Encounter (Signed)
Message copied by Rodolph Bong on Thu Aug 14, 2013  8:13 AM ------      Message from: Vassie Moselle      Created: Wed Aug 13, 2013 10:40 PM      Regarding: labs       Gardnerella pos. Rest of labs neg.  Do you want to treat this?      Vassie Moselle      08/13/2013       ------

## 2013-08-14 NOTE — ED Notes (Signed)
RN to call pt  Rodolph Bong, MD 08/14/13 [phone]

## 2013-08-15 ENCOUNTER — Telehealth (HOSPITAL_COMMUNITY): Payer: Self-pay | Admitting: *Deleted

## 2013-08-15 NOTE — ED Notes (Addendum)
Pt.'s mobile number is incorrect. I called contact and left a message to call. Call 2. Vicki Tran 08/15/2013 Pt. called back.  Pt. verified x 2 and given results.  Pt. told she needs Flagyl for bacterial vaginosis.  Pt. told where to pick up her Rx.   Pt. instructed to no alcohol while taking this medication.  Pt. voiced understanding. Vicki Tran 08/15/2013

## 2014-09-21 ENCOUNTER — Emergency Department (HOSPITAL_COMMUNITY)
Admission: EM | Admit: 2014-09-21 | Discharge: 2014-09-21 | Disposition: A | Discharge status: Home / Self Care | Payer: Self-pay | Attending: Emergency Medicine | Admitting: Emergency Medicine

## 2014-09-21 ENCOUNTER — Emergency Department (HOSPITAL_COMMUNITY): Payer: No Typology Code available for payment source

## 2014-09-21 ENCOUNTER — Encounter (HOSPITAL_COMMUNITY): Payer: Self-pay | Admitting: Emergency Medicine

## 2014-09-21 DIAGNOSIS — Y9389 Activity, other specified: Secondary | ICD-10-CM | POA: Insufficient documentation

## 2014-09-21 DIAGNOSIS — S8992XA Unspecified injury of left lower leg, initial encounter: Secondary | ICD-10-CM | POA: Insufficient documentation

## 2014-09-21 DIAGNOSIS — Y929 Unspecified place or not applicable: Secondary | ICD-10-CM | POA: Insufficient documentation

## 2014-09-21 DIAGNOSIS — Z79899 Other long term (current) drug therapy: Secondary | ICD-10-CM | POA: Insufficient documentation

## 2014-09-21 DIAGNOSIS — Y998 Other external cause status: Secondary | ICD-10-CM | POA: Insufficient documentation

## 2014-09-21 DIAGNOSIS — Y92410 Unspecified street and highway as the place of occurrence of the external cause: Secondary | ICD-10-CM | POA: Insufficient documentation

## 2014-09-21 DIAGNOSIS — M25562 Pain in left knee: Secondary | ICD-10-CM

## 2014-09-21 DIAGNOSIS — Z791 Long term (current) use of non-steroidal anti-inflammatories (NSAID): Secondary | ICD-10-CM | POA: Insufficient documentation

## 2014-09-21 MED ORDER — TRAMADOL HCL 50 MG PO TABS
50.0000 mg | ORAL_TABLET | Freq: Once | ORAL | Status: AC
  Administered 2014-09-21: 50 mg via ORAL
  Filled 2014-09-21: qty 1

## 2014-09-21 MED ORDER — NAPROXEN 500 MG PO TABS: 500.0000 mg | ORAL_TABLET | Freq: Two times a day (BID) | ORAL | Status: DC
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
## 2014-09-21 NOTE — Discharge Instructions (Signed)
Knee Pain °The knee is the complex joint between your thigh and your lower leg. It is made up of bones, tendons, ligaments, and cartilage. The bones that make up the knee are: °· The femur in the thigh. °· The tibia and fibula in the lower leg. °· The patella or kneecap riding in the groove on the lower femur. °CAUSES  °Knee pain is a common complaint with many causes. A few of these causes are: °· Injury, such as: °· A ruptured ligament or tendon injury. °· Torn cartilage. °· Medical conditions, such as: °· Gout °· Arthritis °· Infections °· Overuse, over training, or overdoing a physical activity. °Knee pain can be minor or severe. Knee pain can accompany debilitating injury. Minor knee problems often respond well to self-care measures or get well on their own. More serious injuries may need medical intervention or even surgery. °SYMPTOMS °The knee is complex. Symptoms of knee problems can vary widely. Some of the problems are: °· Pain with movement and weight bearing. °· Swelling and tenderness. °· Buckling of the knee. °· Inability to straighten or extend your knee. °· Your knee locks and you cannot straighten it. °· Warmth and redness with pain and fever. °· Deformity or dislocation of the kneecap. °DIAGNOSIS  °Determining what is wrong may be very straight forward such as when there is an injury. It can also be challenging because of the complexity of the knee. Tests to make a diagnosis may include: °· Your caregiver taking a history and doing a physical exam. °· Routine X-rays can be used to rule out other problems. X-rays will not reveal a cartilage tear. Some injuries of the knee can be diagnosed by: °¨ Arthroscopy a surgical technique by which a small video camera is inserted through tiny incisions on the sides of the knee. This procedure is used to examine and repair internal knee joint problems. Tiny instruments can be used during arthroscopy to repair the torn knee cartilage (meniscus). °¨ Arthrography  is a radiology technique. A contrast liquid is directly injected into the knee joint. Internal structures of the knee joint then become visible on X-ray film. °¨ An MRI scan is a non X-ray radiology procedure in which magnetic fields and a computer produce two- or three-dimensional images of the inside of the knee. Cartilage tears are often visible using an MRI scanner. MRI scans have largely replaced arthrography in diagnosing cartilage tears of the knee. °· Blood work. °· Examination of the fluid that helps to lubricate the knee joint (synovial fluid). This is done by taking a sample out using a needle and a syringe. °TREATMENT °The treatment of knee problems depends on the cause. Some of these treatments are: °· Depending on the injury, proper casting, splinting, surgery, or physical therapy care will be needed. °· Give yourself adequate recovery time. Do not overuse your joints. If you begin to get sore during workout routines, back off. Slow down or do fewer repetitions. °· For repetitive activities such as cycling or running, maintain your strength and nutrition. °· Alternate muscle groups. For example, if you are a weight lifter, work the upper body on one day and the lower body the next. °· Either tight or weak muscles do not give the proper support for your knee. Tight or weak muscles do not absorb the stress placed on the knee joint. Keep the muscles surrounding the knee strong. °· Take care of mechanical problems. °¨ If you have flat feet, orthotics or special shoes may help.   See your caregiver if you need help. °¨ Arch supports, sometimes with wedges on the inner or outer aspect of the heel, can help. These can shift pressure away from the side of the knee most bothered by osteoarthritis. °¨ A brace called an "unloader" brace also may be used to help ease the pressure on the most arthritic side of the knee. °· If your caregiver has prescribed crutches, braces, wraps or ice, use as directed. The acronym  for this is PRICE. This means protection, rest, ice, compression, and elevation. °· Nonsteroidal anti-inflammatory drugs (NSAIDs), can help relieve pain. But if taken immediately after an injury, they may actually increase swelling. Take NSAIDs with food in your stomach. Stop them if you develop stomach problems. Do not take these if you have a history of ulcers, stomach pain, or bleeding from the bowel. Do not take without your caregiver's approval if you have problems with fluid retention, heart failure, or kidney problems. °· For ongoing knee problems, physical therapy may be helpful. °· Glucosamine and chondroitin are over-the-counter dietary supplements. Both may help relieve the pain of osteoarthritis in the knee. These medicines are different from the usual anti-inflammatory drugs. Glucosamine may decrease the rate of cartilage destruction. °· Injections of a corticosteroid drug into your knee joint may help reduce the symptoms of an arthritis flare-up. They may provide pain relief that lasts a few months. You may have to wait a few months between injections. The injections do have a small increased risk of infection, water retention, and elevated blood sugar levels. °· Hyaluronic acid injected into damaged joints may ease pain and provide lubrication. These injections may work by reducing inflammation. A series of shots may give relief for as long as 6 months. °· Topical painkillers. Applying certain ointments to your skin may help relieve the pain and stiffness of osteoarthritis. Ask your pharmacist for suggestions. Many over the-counter products are approved for temporary relief of arthritis pain. °· In some countries, doctors often prescribe topical NSAIDs for relief of chronic conditions such as arthritis and tendinitis. A review of treatment with NSAID creams found that they worked as well as oral medications but without the serious side effects. °PREVENTION °· Maintain a healthy weight. Extra pounds  put more strain on your joints. °· Get strong, stay limber. Weak muscles are a common cause of knee injuries. Stretching is important. Include flexibility exercises in your workouts. °· Be smart about exercise. If you have osteoarthritis, chronic knee pain or recurring injuries, you may need to change the way you exercise. This does not mean you have to stop being active. If your knees ache after jogging or playing basketball, consider switching to swimming, water aerobics, or other low-impact activities, at least for a few days a week. Sometimes limiting high-impact activities will provide relief. °· Make sure your shoes fit well. Choose footwear that is right for your sport. °· Protect your knees. Use the proper gear for knee-sensitive activities. Use kneepads when playing volleyball or laying carpet. Buckle your seat belt every time you drive. Most shattered kneecaps occur in car accidents. °· Rest when you are tired. °SEEK MEDICAL CARE IF:  °You have knee pain that is continual and does not seem to be getting better.  °SEEK IMMEDIATE MEDICAL CARE IF:  °Your knee joint feels hot to the touch and you have a high fever. °MAKE SURE YOU:  °· Understand these instructions. °· Will watch your condition. °· Will get help right away if you are not   doing well or get worse. °Document Released: 08/06/2007 Document Revised: 01/01/2012 Document Reviewed: 08/06/2007 °Exit[employer]® Patient Information ©2015 [employer]. This information is not intended to replace advice given to you by your health care provider. Make sure you discuss any questions you have with your health care provider. ° °Musculoskeletal Pain °Musculoskeletal pain is muscle and boney aches and pains. These pains can occur in any part of the body. Your caregiver may treat you without knowing the cause of the pain. They may treat you if blood or urine tests, X-rays, and other tests were normal.  °CAUSES °There is often not a definite cause or reason for these  pains. These pains may be caused by a type of germ (virus). The discomfort may also come from overuse. Overuse includes working out too hard when your body is not fit. Boney aches also come from weather changes. Bone is sensitive to atmospheric pressure changes. °HOME CARE INSTRUCTIONS  °· Ask when your test results will be ready. Make sure you get your test results. °· Only take over-the-counter or prescription medicines for pain, discomfort, or fever as directed by your caregiver. If you were given medications for your condition, do not drive, operate machinery or power tools, or sign legal documents for 24 hours. Do not drink alcohol. Do not take sleeping pills or other medications that may interfere with treatment. °· Continue all activities unless the activities cause more pain. When the pain lessens, slowly resume normal activities. Gradually increase the intensity and duration of the activities or exercise. °· During periods of severe pain, bed rest may be helpful. Lay or sit in any position that is comfortable. °· Putting ice on the injured area. °¨ Put ice in a bag. °¨ Place a towel between your skin and the bag. °¨ Leave the ice on for 15 to 20 minutes, 3 to 4 times a day. °· Follow up with your caregiver for continued problems and no reason can be found for the pain. If the pain becomes worse or does not go away, it may be necessary to repeat tests or do additional testing. Your caregiver may need to look further for a possible cause. °SEEK IMMEDIATE MEDICAL CARE IF: °· You have pain that is getting worse and is not relieved by medications. °· You develop chest pain that is associated with shortness or breath, sweating, feeling sick to your stomach (nauseous), or throw up (vomit). °· Your pain becomes localized to the abdomen. °· You develop any new symptoms that seem different or that concern you. °MAKE SURE YOU:  °· Understand these instructions. °· Will watch your condition. °· Will get help right away  if you are not doing well or get worse. °Document Released: 10/09/2005 Document Revised: 01/01/2012 Document Reviewed: 06/13/2013 °ExitCare® Patient Information ©2015 [employer]. This information is not intended to replace advice given to you by your health care provider. Make sure you discuss any questions you have with your health care provider. ° °

## 2014-12-19 ENCOUNTER — Emergency Department (HOSPITAL_COMMUNITY)
Admission: EM | Admit: 2014-12-19 | Discharge: 2014-12-19 | Disposition: A | Discharge status: Home / Self Care | Payer: Self-pay | Attending: Emergency Medicine | Admitting: Emergency Medicine

## 2014-12-19 ENCOUNTER — Encounter (HOSPITAL_COMMUNITY): Payer: Self-pay | Admitting: Emergency Medicine

## 2014-12-19 DIAGNOSIS — M65052 Abscess of tendon sheath, left thigh: Secondary | ICD-10-CM | POA: Insufficient documentation

## 2014-12-19 DIAGNOSIS — L0291 Cutaneous abscess, unspecified: Secondary | ICD-10-CM

## 2014-12-19 MED ORDER — CEPHALEXIN 500 MG PO CAPS: 500.0000 mg | ORAL_CAPSULE | Freq: Four times a day (QID) | ORAL | Status: DC

## 2014-12-19 MED ORDER — HYDROCODONE-ACETAMINOPHEN 5-325 MG PO TABS: 1.0000 | ORAL_TABLET | ORAL | Status: DC | PRN

## 2014-12-19 NOTE — ED Notes (Signed)
Pt states she has an abscess to her L leg. States it started out as a pimple and now the area is bigger and painful. States there is a "hole" there now. Pt was able to get some blood and pus exudate from wound. Pt has used warm compresses and epsom salts to treat area. Pt ambulatory to exam room with steady gait.

## 2014-12-19 NOTE — Discharge Instructions (Signed)

## 2014-12-19 NOTE — ED Provider Notes (Signed)
CSN: [id number]     Arrival date & time 12/19/14  2012 History   First MD Initiated Contact with Patient 12/19/14 2058     Chief Complaint  Patient presents with  . Abscess     (Consider location/radiation/quality/duration/timing/severity/associated sxs/prior Treatment) Patient is a 29 y.o. female presenting with abscess. The history is provided by the patient. No language interpreter was used.  Abscess Location:  Leg Leg abscess location:  L upper leg Abscess quality: draining and painful   Red streaking: no   Associated symptoms: no fever and no nausea   Associated symptoms comment:  Abscess to left upper lateral thigh that started one week ago, opened and drained 2-3 days ago and is persistently tender without surrounding redness. She reports a fever in the first 2 days of symptoms but none since.   History reviewed. No pertinent past medical history. History reviewed. No pertinent past surgical history. No family history on file. History  Substance Use Topics  . Smoking status: Never Smoker   . Smokeless tobacco: Not on file  . Alcohol Use: Yes   OB History    No data available     Review of Systems  Constitutional: Negative for fever and chills.  Gastrointestinal: Negative for nausea.  Musculoskeletal: Negative.   Skin:       C/O Abscess.  Neurological: Negative.       Allergies  Review of patient's allergies indicates no known allergies.  Home Medications   Prior to Admission medications   Medication Sig Start Date End Date Taking? Authorizing Provider  metroNIDAZOLE (FLAGYL) 500 MG tablet Take 1 tablet (500 mg total) by mouth 2 (two) times daily. Patient not taking: Reported on 12/19/2014 08/14/13   Rodolph BongEvan S Corey, MD  naproxen (NAPROSYN) 500 MG tablet Take 1 tablet (500 mg total) by mouth 2 (two) times daily. Patient not taking: Reported on 12/19/2014 09/21/14   Dorthula Matasiffany G Greene, PA-C   BP 104/63 mmHg  Pulse 90  Temp(Src) 98.1 F (36.7 C) (Oral)  Resp  16  SpO2 100% Physical Exam  Constitutional: She is oriented to person, place, and time. She appears well-developed and well-nourished.  Neck: Normal range of motion.  Pulmonary/Chest: Effort normal.  Musculoskeletal:  Draining abscess to lateral left thigh with minimal induration and surrounding redness. Moderately tender.   Neurological: She is alert and oriented to person, place, and time.  Skin: Skin is warm and dry.    ED Course  Procedures (including critical care time) Labs Review Labs Reviewed - No data to display  Imaging Review No results found.   EKG Interpretation None      MDM   Final diagnoses:  None    1. Abscess  No further drainage required emergently. Will start antibiotics, encourage warm compresses and 2 day recheck if symptoms do not continue to improve.     Arnoldo HookerShari A Asheton Scheffler, PA-C 12/19/14 2133  Candyce ChurnJohn David Wofford III, MD 12/20/14 2114
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
## 2015-11-17 ENCOUNTER — Encounter (HOSPITAL_COMMUNITY): Payer: Self-pay | Admitting: *Deleted

## 2015-11-17 ENCOUNTER — Inpatient Hospital Stay (HOSPITAL_COMMUNITY)
Admission: AD | Admit: 2015-11-17 | Discharge: 2015-11-17 | Disposition: A | Discharge status: Home / Self Care | Payer: Medicaid Other | Source: Ambulatory Visit | Attending: Obstetrics & Gynecology | Admitting: Obstetrics & Gynecology

## 2015-11-17 ENCOUNTER — Inpatient Hospital Stay (HOSPITAL_COMMUNITY): Payer: Medicaid Other

## 2015-11-17 DIAGNOSIS — O219 Vomiting of pregnancy, unspecified: Secondary | ICD-10-CM | POA: Diagnosis not present

## 2015-11-17 DIAGNOSIS — R112 Nausea with vomiting, unspecified: Secondary | ICD-10-CM | POA: Insufficient documentation

## 2015-11-17 DIAGNOSIS — Z3A01 Less than 8 weeks gestation of pregnancy: Secondary | ICD-10-CM | POA: Insufficient documentation

## 2015-11-17 DIAGNOSIS — O4691 Antepartum hemorrhage, unspecified, first trimester: Secondary | ICD-10-CM | POA: Insufficient documentation

## 2015-11-17 DIAGNOSIS — O209 Hemorrhage in early pregnancy, unspecified: Secondary | ICD-10-CM

## 2015-11-17 HISTORY — DX: Major depressive disorder, single episode, unspecified: F32.9

## 2015-11-17 LAB — WET PREP, GENITAL
Sperm: NONE SEEN
TRICH WET PREP: NONE SEEN
Yeast Wet Prep HPF POC: NONE SEEN

## 2015-11-17 LAB — URINALYSIS, ROUTINE W REFLEX MICROSCOPIC
Bilirubin Urine: NEGATIVE
Glucose, UA: NEGATIVE mg/dL
Hgb urine dipstick: NEGATIVE
KETONES UR: 15 mg/dL — AB
Leukocytes, UA: NEGATIVE
NITRITE: NEGATIVE
PH: 7 (ref 5.0–8.0)
PROTEIN: NEGATIVE mg/dL
Specific Gravity, Urine: 1.015 (ref 1.005–1.030)

## 2015-11-17 LAB — CBC
HCT: 34.2 % — ABNORMAL LOW (ref 36.0–46.0)
Hemoglobin: 11.5 g/dL — ABNORMAL LOW (ref 12.0–15.0)
MCH: 27.7 pg (ref 26.0–34.0)
MCHC: 33.6 g/dL (ref 30.0–36.0)
MCV: 82.4 fL (ref 78.0–100.0)
PLATELETS: 171 10*3/uL (ref 150–400)
RBC: 4.15 MIL/uL (ref 3.87–5.11)
RDW: 13.5 % (ref 11.5–15.5)
WBC: 5.4 10*3/uL (ref 4.0–10.5)

## 2015-11-17 LAB — POCT PREGNANCY, URINE: Preg Test, Ur: POSITIVE — AB

## 2015-11-17 LAB — ABO/RH: ABO/RH(D): AB POS

## 2015-11-17 LAB — HCG, QUANTITATIVE, PREGNANCY: HCG, BETA CHAIN, QUANT, S: 116121 m[IU]/mL — AB (ref <5)

## 2015-11-17 MED ORDER — LACTATED RINGERS IV BOLUS (SEPSIS)
1000.0000 mL | Freq: Once | INTRAVENOUS | Status: AC
  Administered 2015-11-17: 1000 mL via INTRAVENOUS

## 2015-11-17 MED ORDER — PROMETHAZINE HCL 25 MG PO TABS: ORAL_TABLET | ORAL | Status: DC

## 2015-11-17 MED ORDER — METOCLOPRAMIDE HCL 5 MG/ML IJ SOLN
10.0000 mg | Freq: Once | INTRAMUSCULAR | Status: AC
  Administered 2015-11-17: 10 mg via INTRAVENOUS
  Filled 2015-11-17: qty 2

## 2015-11-17 NOTE — MAU Note (Signed)
fo the past 3 days, been nauseated and vomiting. Noted blood in urine last night, was spotting 3 days ago.  Is dizzy, ? Dehydrated.

## 2015-11-17 NOTE — MAU Provider Note (Signed)
CSN: [id number]     Arrival date & time 11/17/15  1605 History   None    Chief Complaint  Patient presents with  . Emesis  . Dizziness  . Vaginal Bleeding     (Consider location/radiation/quality/duration/timing/severity/associated sxs/prior Treatment) Patient is a 30 y.o. female presenting with vaginal bleeding. The history is provided by the patient.  Vaginal Bleeding This is a new problem. The problem occurs constantly. The problem has been gradually worsening. Associated symptoms include abdominal pain, chills, congestion, nausea and vomiting. Pertinent negatives include no chest pain, coughing, headaches, rash or sore throat. Nothing aggravates the symptoms. She has tried nothing for the symptoms.   Jared Cahn is a 30 y.o. G1P0 @ [redacted]w[redacted]d gestation who presents to the ED with vaginal bleeding and abdominal pain. The bleeding started 3 days ago as spotting and has gotten worse. The abdominal pain started about a week ago.  Past Medical History  Diagnosis Date  . Depression 2007   History reviewed. No pertinent past surgical history. History reviewed. No pertinent family history. Social History  Substance Use Topics  . Smoking status: Current Every Day Smoker -- 0.50 packs/day    Types: Cigarettes  . Smokeless tobacco: None  . Alcohol Use: No   OB History    Gravida Para Term Preterm AB TAB SAB Ectopic Multiple Living   1              Review of Systems  Constitutional: Positive for chills.  HENT: Positive for congestion and sinus pressure. Negative for sore throat.   Eyes: Negative for pain, redness, itching and visual disturbance.  Respiratory: Negative for cough and wheezing.   Cardiovascular: Negative for chest pain and leg swelling.  Gastrointestinal: Positive for nausea, vomiting, abdominal pain and constipation. Negative for diarrhea.  Genitourinary: Positive for frequency, vaginal bleeding and vaginal discharge. Negative for dysuria and urgency.  Musculoskeletal:  Positive for back pain. Negative for gait problem.  Skin: Negative for rash.  Neurological: Negative for syncope and headaches.  Psychiatric/Behavioral: Negative for confusion. The patient is not nervous/anxious.       Allergies  Review of patient's allergies indicates no known allergies.  Home Medications   Prior to Admission medications   Medication Sig Start Date End Date Taking? Authorizing Provider  cephALEXin (KEFLEX) 500 MG capsule Take 1 capsule (500 mg total) by mouth 4 (four) times daily. Patient not taking: Reported on 11/17/2015 12/19/14   Elpidio Anis, PA-C  HYDROcodone-acetaminophen (NORCO/VICODIN) 5-325 MG per tablet Take 1-2 tablets by mouth every 4 (four) hours as needed. Patient not taking: Reported on 11/17/2015 12/19/14   Elpidio Anis, PA-C  metroNIDAZOLE (FLAGYL) 500 MG tablet Take 1 tablet (500 mg total) by mouth 2 (two) times daily. Patient not taking: Reported on 12/19/2014 08/14/13   Rodolph Bong, MD  naproxen (NAPROSYN) 500 MG tablet Take 1 tablet (500 mg total) by mouth 2 (two) times daily. Patient not taking: Reported on 12/19/2014 09/21/14   Marlon Pel, PA-C  promethazine (PHENERGAN) 25 MG tablet Take 1/2 to 1 tablet PO Q 6 hours prn nausea 11/17/15   Hope Orlene Och, NP   BP 101/56 mmHg  Pulse 78  Temp(Src) 98.9 F (37.2 C) (Oral)  Resp 20  Ht  (1.676 m)  Wt 106 lb 3.2 oz (48.172 kg)  BMI 17.15 kg/m2  SpO2 100%  LMP 09/24/2015 Physical Exam  Constitutional: She is oriented to person, place, and time. She appears well-developed and well-nourished. No distress.  HENT:  Head: Normocephalic and atraumatic.  Eyes: EOM are normal.  Neck: Neck supple.  Cardiovascular: Normal rate and regular rhythm.   Pulmonary/Chest: Effort normal. She has no wheezes. She has no rales.  Abdominal: Soft. Bowel sounds are normal. There is no tenderness.  Unable to reproduce the cramping pain that the patient is having.  Genitourinary:  External genitalia without  lesions, thick white d/c vaginal vault, scant blood, cervix long and closed, no CMT, no adnexal tenderness. Uterus approximately 8 week size.   Musculoskeletal: Normal range of motion. She exhibits no edema.  Neurological: She is alert and oriented to person, place, and time. No cranial nerve deficit.  Skin: Skin is warm and dry.  Psychiatric: She has a normal mood and affect. Her behavior is normal.  Nursing note and vitals reviewed.   ED Course  Procedures (including critical care time) Labs Review Labs Reviewed  WET PREP, GENITAL - Abnormal; Notable for the following:    Clue Cells Wet Prep HPF POC PRESENT (*)    WBC, Wet Prep HPF POC FEW (*)    All other components within normal limits  URINALYSIS, ROUTINE W REFLEX MICROSCOPIC (NOT AT [facility]) - Abnormal; Notable for the following:    APPearance HAZY (*)    Ketones, ur 15 (*)    All other components within normal limits  CBC - Abnormal; Notable for the following:    Hemoglobin 11.5 (*)    HCT 34.2 (*)    All other components within normal limits  HCG, QUANTITATIVE, PREGNANCY - Abnormal; Notable for the following:    hCG, Beta Chain, [place] Longest [id number] (*)    All other components within normal limits  POCT PREGNANCY, URINE - Abnormal; Notable for the following:    Preg Test, Ur POSITIVE (*)    All other components within normal limits  HIV ANTIBODY (ROUTINE TESTING)  ABO/RH  GC/CHLAMYDIA PROBE AMP ([place]) NOT AT [facility]    Imaging Review US Ob Comp Less 14 Wks  11/17/2015  CLINICAL DATA:  Vaginal bleeding EXAM: OBSTETRIC <14 WK [place] AND TRANSVAGINAL OB US TECHNIQUE: Both transabdominal and transvaginal ultrasound examinations were performed for complete evaluation of the gestation as well as the maternal uterus, adnexal regions, and pelvic cul-de-sac. Transvaginal technique was performed to assess early pregnancy. COMPARISON:  None. FINDINGS: Intrauterine gestational sac: Single and fundal. Yolk sac:  Present. Embryo:  Present.  Cardiac Activity: Present. Heart Rate: 145  bpm CRL:  15.7  mm   8 w   0 d                  [place] EDC: 06/28/2016 Subchorionic hemorrhage:  None visualized. Maternal uterus/adnexae: Left ovary contains a 2.3 cm corpus luteum cyst. Right ovary is unremarkable. No free-fluid. No subchorionic hemorrhage. No pelvic mass. IMPRESSION: Single live intrauterine gestation with an estimated gestational age of [redacted] weeks and 0 days. Fetal heart rate is 145 beats per minute. Electronically Signed   By: Jolaine Click M.D.   On: 11/17/2015 18:07   US Ob Transvaginal  11/17/2015  CLINICAL DATA:  Vaginal bleeding EXAM: OBSTETRIC <14 WK [place] AND TRANSVAGINAL OB US TECHNIQUE: Both transabdominal and transvaginal ultrasound examinations were performed for complete evaluation of the gestation as well as the maternal uterus, adnexal regions, and pelvic cul-de-sac. Transvaginal technique was performed to assess early pregnancy. COMPARISON:  None. FINDINGS: Intrauterine gestational sac: Single and fundal. Yolk sac:  Present. Embryo:  Present. Cardiac Activity: Present. Heart Rate: 145  bpm CRL:  15.7  mm   8 w   0 d                  [place] EDC: 06/28/2016 Subchorionic hemorrhage:  None visualized. Maternal uterus/adnexae: Left ovary contains a 2.3 cm corpus luteum cyst. Right ovary is unremarkable. No free-fluid. No subchorionic hemorrhage. No pelvic mass. IMPRESSION: Single live intrauterine gestation with an estimated gestational age of [redacted] weeks and 0 days. Fetal heart rate is 145 beats per minute. Electronically Signed   By: Jolaine Click M.D.   On: 11/17/2015 18:07   I have personally reviewed and evaluated the lab results as part of my medical decision-making.   MDM  30 y.o. G1P0 @ [redacted]w[redacted]d gestation with lower abdominal cramping and spotting stable for d/c feeling much better after IV hydration and Reglan for nausea. Ultrasound with viable IUP no adnexal mass. Discussed with the patient clinical, lab and U/S findings and plan of care and all  questioned fully answered. She will start prenatal care and return here if any problems arise.   Final diagnoses:  Nausea and vomiting during pregnancy prior to [redacted] weeks gestation  vaginal bleeding in first trimester pregnancy.

## 2015-11-17 NOTE — Discharge Instructions (Signed)
Your ultrasound today shows that you are [redacted] weeks pregnant. Start your prenatal care. Return as needed for problems   Morning Sickness Morning sickness is when you feel sick to your stomach (nauseous) during pregnancy. This nauseous feeling may or may not come with vomiting. It often occurs in the morning but can be a problem any time of day. Morning sickness is most common during the first trimester, but it may continue throughout pregnancy. While morning sickness is unpleasant, it is usually harmless unless you develop severe and continual vomiting (hyperemesis gravidarum). This condition requires more intense treatment.  CAUSES  The cause of morning sickness is not completely known but seems to be related to normal hormonal changes that occur in pregnancy. RISK FACTORS You are at greater risk if you:  Experienced nausea or vomiting before your pregnancy.  Had morning sickness during a previous pregnancy.  Are pregnant with more than one baby, such as twins. TREATMENT  Do not use any medicines (prescription, over-the-counter, or herbal) for morning sickness without first talking to your health care provider. Your health care provider may prescribe or recommend:  Vitamin B6 supplements.  Anti-nausea medicines.  The herbal medicine ginger. HOME CARE INSTRUCTIONS   Only take over-the-counter or prescription medicines as directed by your health care provider.  Taking multivitamins before getting pregnant can prevent or decrease the severity of morning sickness in most women.  Eat a piece of dry toast or unsalted crackers before getting out of bed in the morning.  Eat five or six small meals a day.  Eat dry and bland foods (rice, baked potato). Foods high in carbohydrates are often helpful.  Do not drink liquids with your meals. Drink liquids between meals.  Avoid greasy, fatty, and spicy foods.  Get someone to cook for you if the smell of any food causes nausea and vomiting.  If  you feel nauseous after taking prenatal vitamins, take the vitamins at night or with a snack.  Snack on protein foods (nuts, yogurt, cheese) between meals if you are hungry.  Eat unsweetened gelatins for desserts.  Wearing an acupressure wristband (worn for sea sickness) may be helpful.  Acupuncture may be helpful.  Do not smoke.  Get a humidifier to keep the air in your house free of odors.  Get plenty of fresh air. SEEK MEDICAL CARE IF:   Your home remedies are not working, and you need medicine.  You feel dizzy or lightheaded.  You are losing weight. SEEK IMMEDIATE MEDICAL CARE IF:   You have persistent and uncontrolled nausea and vomiting.  You pass out (faint). MAKE SURE YOU:  Understand these instructions.  Will watch your condition.  Will get help right away if you are not doing well or get worse.   This information is not intended to replace advice given to you by your health care provider. Make sure you discuss any questions you have with your health care provider.   Document Released: 11/30/2006 Document Revised: 10/14/2013 Document Reviewed: 03/26/2013 Elsevier Interactive Patient Education 2016 [employer]  Vaginal Bleeding During Pregnancy, First Trimester A small amount of bleeding (spotting) from the vagina is relatively common in early pregnancy. It usually stops on its own. Various things may cause bleeding or spotting in early pregnancy. Some bleeding may be related to the pregnancy, and some may not. In most cases, the bleeding is normal and is not a problem. However, bleeding can also be a sign of something serious. Be sure to tell your health care  provider about any vaginal bleeding right away. Some possible causes of vaginal bleeding during the first trimester include:  Infection or inflammation of the cervix.  Growths (polyps) on the cervix.  Miscarriage or threatened miscarriage.  Pregnancy tissue has developed outside of the uterus and in  a fallopian tube (tubal pregnancy).  Tiny cysts have developed in the uterus instead of pregnancy tissue (molar pregnancy). HOME CARE INSTRUCTIONS  Watch your condition for any changes. The following actions may help to lessen any discomfort you are feeling:  Follow your health care provider's instructions for limiting your activity. If your health care provider orders bed rest, you may need to stay in bed and only get up to use the bathroom. However, your health care provider may allow you to continue light activity.  If needed, make plans for someone to help with your regular activities and responsibilities while you are on bed rest.  Keep track of the number of pads you use each day, how often you change pads, and how soaked (saturated) they are. Write this down.  Do not use tampons. Do not douche.  Do not have sexual intercourse or orgasms until approved by your health care provider.  If you pass any tissue from your vagina, save the tissue so you can show it to your health care provider.  Only take over-the-counter or prescription medicines as directed by your health care provider.  Do not take aspirin because it can make you bleed.  Keep all follow-up appointments as directed by your health care provider. SEEK MEDICAL CARE IF:  You have any vaginal bleeding during any part of your pregnancy.  You have cramps or labor pains.  You have a fever, not controlled by medicine. SEEK IMMEDIATE MEDICAL CARE IF:   You have severe cramps in your back or belly (abdomen).  You pass large clots or tissue from your vagina.  Your bleeding increases.  You feel light-headed or weak, or you have fainting episodes.  You have chills.  You are leaking fluid or have a gush of fluid from your vagina.  You pass out while having a bowel movement. MAKE SURE YOU:  Understand these instructions.  Will watch your condition.  Will get help right away if you are not doing well or get worse.    This information is not intended to replace advice given to you by your health care provider. Make sure you discuss any questions you have with your health care provider.   Document Released: 07/19/2005 Document Revised: 10/14/2013 Document Reviewed: 06/16/2013 Elsevier Interactive Patient Education [employer]

## 2015-11-18 LAB — GC/CHLAMYDIA PROBE AMP (~~LOC~~) NOT AT ARMC
Chlamydia: NEGATIVE
Neisseria Gonorrhea: NEGATIVE

## 2015-11-18 LAB — HIV ANTIBODY (ROUTINE TESTING W REFLEX): HIV Screen 4th Generation wRfx: NONREACTIVE

## 2015-12-10 ENCOUNTER — Inpatient Hospital Stay (HOSPITAL_COMMUNITY)
Admission: AD | Admit: 2015-12-10 | Discharge: 2015-12-10 | Disposition: A | Discharge status: Home / Self Care | Payer: Medicaid Other | Source: Ambulatory Visit | Attending: Obstetrics & Gynecology | Admitting: Obstetrics & Gynecology

## 2015-12-10 ENCOUNTER — Encounter (HOSPITAL_COMMUNITY): Payer: Self-pay | Admitting: *Deleted

## 2015-12-10 DIAGNOSIS — G43009 Migraine without aura, not intractable, without status migrainosus: Secondary | ICD-10-CM

## 2015-12-10 DIAGNOSIS — O9989 Other specified diseases and conditions complicating pregnancy, childbirth and the puerperium: Secondary | ICD-10-CM | POA: Diagnosis not present

## 2015-12-10 DIAGNOSIS — Z3A11 11 weeks gestation of pregnancy: Secondary | ICD-10-CM | POA: Insufficient documentation

## 2015-12-10 DIAGNOSIS — R51 Headache: Secondary | ICD-10-CM | POA: Diagnosis present

## 2015-12-10 DIAGNOSIS — F17210 Nicotine dependence, cigarettes, uncomplicated: Secondary | ICD-10-CM | POA: Diagnosis not present

## 2015-12-10 DIAGNOSIS — G43909 Migraine, unspecified, not intractable, without status migrainosus: Secondary | ICD-10-CM | POA: Insufficient documentation

## 2015-12-10 DIAGNOSIS — O99351 Diseases of the nervous system complicating pregnancy, first trimester: Secondary | ICD-10-CM | POA: Diagnosis not present

## 2015-12-10 DIAGNOSIS — O99331 Smoking (tobacco) complicating pregnancy, first trimester: Secondary | ICD-10-CM | POA: Diagnosis not present

## 2015-12-10 LAB — URINALYSIS, ROUTINE W REFLEX MICROSCOPIC
Bilirubin Urine: NEGATIVE
Glucose, UA: NEGATIVE mg/dL
HGB URINE DIPSTICK: NEGATIVE
Ketones, ur: NEGATIVE mg/dL
LEUKOCYTES UA: NEGATIVE
Nitrite: NEGATIVE
PH: 6.5 (ref 5.0–8.0)
PROTEIN: NEGATIVE mg/dL
Specific Gravity, Urine: 1.02 (ref 1.005–1.030)

## 2015-12-10 LAB — CBC
HCT: 30.7 % — ABNORMAL LOW (ref 36.0–46.0)
Hemoglobin: 10.3 g/dL — ABNORMAL LOW (ref 12.0–15.0)
MCH: 27.7 pg (ref 26.0–34.0)
MCHC: 33.6 g/dL (ref 30.0–36.0)
MCV: 82.5 fL (ref 78.0–100.0)
Platelets: 189 10*3/uL (ref 150–400)
RBC: 3.72 MIL/uL — ABNORMAL LOW (ref 3.87–5.11)
RDW: 14.4 % (ref 11.5–15.5)
WBC: 5.7 10*3/uL (ref 4.0–10.5)

## 2015-12-10 MED ORDER — LACTATED RINGERS IV BOLUS (SEPSIS)
1000.0000 mL | Freq: Once | INTRAVENOUS | Status: AC
Start: 2015-12-10 — End: 2015-12-10
  Administered 2015-12-10: 1000 mL via INTRAVENOUS

## 2015-12-10 MED ORDER — DEXAMETHASONE SODIUM PHOSPHATE 10 MG/ML IJ SOLN
10.0000 mg | Freq: Once | INTRAMUSCULAR | Status: AC
  Administered 2015-12-10: 10 mg via INTRAVENOUS
  Filled 2015-12-10: qty 1

## 2015-12-10 MED ORDER — OXYCODONE-ACETAMINOPHEN 5-325 MG PO TABS
2.0000 | ORAL_TABLET | Freq: Once | ORAL | Status: AC
  Administered 2015-12-10: 2 via ORAL
  Filled 2015-12-10: qty 2

## 2015-12-10 MED ORDER — LACTATED RINGERS IV SOLN
INTRAVENOUS | Status: DC
  Administered 2015-12-10: 10:00:00 via INTRAVENOUS

## 2015-12-10 MED ORDER — BUTALBITAL-APAP-CAFFEINE 50-325-40 MG PO TABS: 1.0000 | ORAL_TABLET | Freq: Four times a day (QID) | ORAL | Status: DC | PRN

## 2015-12-10 MED ORDER — DIPHENHYDRAMINE HCL 50 MG/ML IJ SOLN
25.0000 mg | Freq: Once | INTRAMUSCULAR | Status: AC
  Administered 2015-12-10: 25 mg via INTRAVENOUS
  Filled 2015-12-10: qty 1

## 2015-12-10 MED ORDER — METOCLOPRAMIDE HCL 5 MG/ML IJ SOLN
10.0000 mg | Freq: Once | INTRAMUSCULAR | Status: AC
  Administered 2015-12-10: 10 mg via INTRAVENOUS
  Filled 2015-12-10: qty 2

## 2015-12-10 NOTE — MAU Note (Signed)
Pt states she is having pain behind her eyes and blurred vision.  Blurred vision started Wednesday.  Pt states the right side of her face swelled up on Thursday and went away that day.  Pt states she also has a bad tooth on that side.  Pt states her head has been hurting since the swelling went down.  Pt states she had blood in her stool but it was not thick.  Pt states it took her a long time to have a bowel movement.  Pt states she just saw the blood on Tuesday and has not seen any more.

## 2015-12-10 NOTE — Discharge Instructions (Signed)

## 2015-12-10 NOTE — MAU Provider Note (Signed)
  History     CSN: [id number]  Arrival date and time: 12/10/15 4098   First Provider Initiated Contact with Patient 12/10/15 [phone]      Chief Complaint  Patient presents with  . Headache  . Blurred Vision   HPI Vicki Tran 30 y.o. G1P0 [redacted]w[redacted]d presents with a headache, photophobia, eye pain and blurry vision. She states she has a "bad tooth" on her right side of her mouth Past Medical History  Diagnosis Date  . Depression 2007    Past Surgical History  Procedure Laterality Date  . No past surgeries      History reviewed. No pertinent family history.  Social History  Substance Use Topics  . Smoking status: Current Every Day Smoker -- 0.50 packs/day    Types: Cigarettes  . Smokeless tobacco: None  . Alcohol Use: No    Allergies: No Known Allergies  Prescriptions prior to admission  Medication Sig Dispense Refill Last Dose  . acetaminophen (TYLENOL) 500 MG tablet Take 500 mg by mouth every 6 (six) hours as needed for mild pain or headache.   12/09/2015 at Unknown time  . Prenatal Vit-Fe Fumarate-FA (PRENATAL MULTIVITAMIN) TABS tablet Take 1 tablet by mouth daily at 12 noon.   12/10/2015 at Unknown time  . promethazine (PHENERGAN) 25 MG tablet Take 1/2 to 1 tablet PO Q 6 hours prn nausea 20 tablet 0 Past Week at Unknown time  . [DISCONTINUED] metroNIDAZOLE (FLAGYL) 500 MG tablet Take 1 tablet (500 mg total) by mouth 2 (two) times daily. (Patient not taking: Reported on 12/19/2014) 14 tablet 0 Completed Course at Unknown time  . [DISCONTINUED] naproxen (NAPROSYN) 500 MG tablet Take 1 tablet (500 mg total) by mouth 2 (two) times daily. (Patient not taking: Reported on 12/19/2014) 30 tablet 0 Completed Course at Unknown time    Review of Systems  HENT:       Right side tooth pain  Eyes: Positive for blurred vision, photophobia and pain.  Gastrointestinal: Positive for blood in stool.  Neurological: Positive for headaches.  All other systems reviewed and are  negative.  Physical Exam   Blood pressure 106/63, pulse 102, temperature 98 F (36.7 C), temperature source Oral, resp. rate 16, last menstrual period 09/24/2015.  Physical Exam  Nursing note and vitals reviewed. Constitutional: She is oriented to person, place, and time. She appears well-developed and well-nourished.  HENT:  Head: Normocephalic and atraumatic.  Neck: Normal range of motion.  Cardiovascular: Normal rate.   Respiratory: Effort normal and breath sounds normal. No respiratory distress.  GI: Soft. She exhibits no distension and no mass. There is no tenderness. There is no rebound and no guarding.  Genitourinary: Vagina normal.  Musculoskeletal: Normal range of motion.  Neurological: She is alert and oriented to person, place, and time.  Skin: Skin is warm and dry.  Psychiatric: She has a normal mood and affect. Her behavior is normal. Judgment and thought content normal.    MAU Course  Procedures  MDM Headache Cocktail; CBC wnl; IV fluids and percocet; Pt has had much improvement with headache and will send home with fioricet. She will need to follow up with a dentist for her dental pain  Assessment and Plan  Migraine headache  Fioricet #20 Discharge   Milena Liggett Grissett 12/10/2015, 11:41 AM

## 2015-12-16 ENCOUNTER — Other Ambulatory Visit (HOSPITAL_COMMUNITY): Payer: Self-pay | Admitting: Nurse Practitioner

## 2015-12-16 DIAGNOSIS — Z3A13 13 weeks gestation of pregnancy: Secondary | ICD-10-CM

## 2015-12-16 DIAGNOSIS — Z3682 Encounter for antenatal screening for nuchal translucency: Secondary | ICD-10-CM

## 2015-12-16 LAB — OB RESULTS CONSOLE RPR: RPR: NONREACTIVE

## 2015-12-16 LAB — OB RESULTS CONSOLE RUBELLA ANTIBODY, IGM: Rubella: IMMUNE

## 2015-12-16 LAB — OB RESULTS CONSOLE ABO/RH: RH TYPE: POSITIVE

## 2015-12-16 LAB — OB RESULTS CONSOLE HEPATITIS B SURFACE ANTIGEN: HEP B S AG: NEGATIVE

## 2015-12-16 LAB — OB RESULTS CONSOLE ANTIBODY SCREEN: Antibody Screen: NEGATIVE

## 2015-12-23 ENCOUNTER — Other Ambulatory Visit (HOSPITAL_COMMUNITY): Payer: Self-pay | Admitting: Nurse Practitioner

## 2015-12-23 ENCOUNTER — Encounter (HOSPITAL_COMMUNITY): Payer: Self-pay

## 2015-12-23 ENCOUNTER — Ambulatory Visit (HOSPITAL_COMMUNITY)
Admission: RE | Admit: 2015-12-23 | Discharge: 2015-12-23 | Disposition: A | Discharge status: Home / Self Care | Payer: Medicaid Other | Source: Ambulatory Visit | Attending: Nurse Practitioner | Admitting: Nurse Practitioner

## 2015-12-23 DIAGNOSIS — Z369 Encounter for antenatal screening, unspecified: Secondary | ICD-10-CM

## 2015-12-23 DIAGNOSIS — Z3A12 12 weeks gestation of pregnancy: Secondary | ICD-10-CM | POA: Diagnosis not present

## 2015-12-23 DIAGNOSIS — Z3A13 13 weeks gestation of pregnancy: Secondary | ICD-10-CM

## 2015-12-23 DIAGNOSIS — Z36 Encounter for antenatal screening of mother: Secondary | ICD-10-CM | POA: Insufficient documentation

## 2015-12-23 DIAGNOSIS — Z3682 Encounter for antenatal screening for nuchal translucency: Secondary | ICD-10-CM

## 2015-12-31 ENCOUNTER — Other Ambulatory Visit (HOSPITAL_COMMUNITY): Payer: Self-pay

## 2016-06-01 LAB — OB RESULTS CONSOLE GC/CHLAMYDIA
CHLAMYDIA, DNA PROBE: NEGATIVE
Gonorrhea: NEGATIVE

## 2016-06-01 LAB — OB RESULTS CONSOLE GBS: GBS: POSITIVE

## 2016-07-03 ENCOUNTER — Inpatient Hospital Stay (HOSPITAL_COMMUNITY)
Admission: AD | Admit: 2016-07-03 | Discharge: 2016-07-05 | DRG: 775 | Disposition: A | Discharge status: Home / Self Care | Payer: Medicaid Other | Source: Ambulatory Visit | Attending: Family Medicine | Admitting: Family Medicine

## 2016-07-03 ENCOUNTER — Encounter (HOSPITAL_COMMUNITY): Payer: Self-pay

## 2016-07-03 DIAGNOSIS — Z3A40 40 weeks gestation of pregnancy: Secondary | ICD-10-CM

## 2016-07-03 DIAGNOSIS — Z3403 Encounter for supervision of normal first pregnancy, third trimester: Secondary | ICD-10-CM | POA: Diagnosis present

## 2016-07-03 DIAGNOSIS — Z833 Family history of diabetes mellitus: Secondary | ICD-10-CM | POA: Diagnosis not present

## 2016-07-03 DIAGNOSIS — O99334 Smoking (tobacco) complicating childbirth: Secondary | ICD-10-CM | POA: Diagnosis present

## 2016-07-03 DIAGNOSIS — O99824 Streptococcus B carrier state complicating childbirth: Secondary | ICD-10-CM | POA: Diagnosis present

## 2016-07-03 DIAGNOSIS — F17210 Nicotine dependence, cigarettes, uncomplicated: Secondary | ICD-10-CM | POA: Diagnosis present

## 2016-07-03 DIAGNOSIS — Z3490 Encounter for supervision of normal pregnancy, unspecified, unspecified trimester: Secondary | ICD-10-CM

## 2016-07-03 LAB — CBC
HCT: 38.8 % (ref 36.0–46.0)
Hemoglobin: 13.5 g/dL (ref 12.0–15.0)
MCH: 29.4 pg (ref 26.0–34.0)
MCHC: 34.8 g/dL (ref 30.0–36.0)
MCV: 84.5 fL (ref 78.0–100.0)
PLATELETS: 124 10*3/uL — AB (ref 150–400)
RBC: 4.59 MIL/uL (ref 3.87–5.11)
RDW: 14.9 % (ref 11.5–15.5)
WBC: 6 10*3/uL (ref 4.0–10.5)

## 2016-07-03 LAB — TYPE AND SCREEN
ABO/RH(D): AB POS
ANTIBODY SCREEN: NEGATIVE

## 2016-07-03 MED ORDER — FENTANYL CITRATE (PF) 100 MCG/2ML IJ SOLN
100.0000 ug | INTRAMUSCULAR | Status: DC | PRN
  Administered 2016-07-03 (×2): 100 ug via INTRAVENOUS
  Filled 2016-07-03 (×3): qty 2

## 2016-07-03 MED ORDER — ACETAMINOPHEN 325 MG PO TABS: 650.0000 mg | ORAL_TABLET | ORAL | Status: DC | PRN

## 2016-07-03 MED ORDER — LACTATED RINGERS IV SOLN
INTRAVENOUS | Status: DC
  Administered 2016-07-03 (×2): via INTRAVENOUS

## 2016-07-03 MED ORDER — PENICILLIN G POTASSIUM 5000000 UNITS IJ SOLR
2.5000 10*6.[IU] | INTRAVENOUS | Status: DC
  Administered 2016-07-03 – 2016-07-04 (×3): 2.5 10*6.[IU] via INTRAVENOUS
  Filled 2016-07-03 (×7): qty 2.5

## 2016-07-03 MED ORDER — PENICILLIN G POTASSIUM 5000000 UNITS IJ SOLR
5.0000 10*6.[IU] | Freq: Once | INTRAVENOUS | Status: AC
  Administered 2016-07-03: 5 10*6.[IU] via INTRAVENOUS
  Filled 2016-07-03: qty 5

## 2016-07-03 MED ORDER — OXYTOCIN 40 UNITS IN LACTATED RINGERS INFUSION - SIMPLE MED
1.0000 m[IU]/min | INTRAVENOUS | Status: DC
  Administered 2016-07-03: 2 m[IU]/min via INTRAVENOUS

## 2016-07-03 MED ORDER — LACTATED RINGERS IV SOLN
500.0000 mL | INTRAVENOUS | Status: DC | PRN
Start: 2016-07-03 — End: 2016-07-04

## 2016-07-03 MED ORDER — LIDOCAINE HCL (PF) 1 % IJ SOLN
30.0000 mL | INTRAMUSCULAR | Status: DC | PRN
  Filled 2016-07-03: qty 30

## 2016-07-03 MED ORDER — OXYCODONE-ACETAMINOPHEN 5-325 MG PO TABS: 2.0000 | ORAL_TABLET | ORAL | Status: DC | PRN

## 2016-07-03 MED ORDER — OXYCODONE-ACETAMINOPHEN 5-325 MG PO TABS: 1.0000 | ORAL_TABLET | ORAL | Status: DC | PRN

## 2016-07-03 MED ORDER — SOD CITRATE-CITRIC ACID 500-334 MG/5ML PO SOLN: 30.0000 mL | ORAL | Status: DC | PRN

## 2016-07-03 MED ORDER — OXYTOCIN BOLUS FROM INFUSION: 500.0000 mL | Freq: Once | INTRAVENOUS | Status: DC

## 2016-07-03 MED ORDER — TERBUTALINE SULFATE 1 MG/ML IJ SOLN
0.2500 mg | Freq: Once | INTRAMUSCULAR | Status: DC | PRN
  Filled 2016-07-03: qty 1

## 2016-07-03 MED ORDER — ONDANSETRON HCL 4 MG/2ML IJ SOLN: 4.0000 mg | Freq: Four times a day (QID) | INTRAMUSCULAR | Status: DC | PRN

## 2016-07-03 MED ORDER — OXYTOCIN 40 UNITS IN LACTATED RINGERS INFUSION - SIMPLE MED
2.5000 [IU]/h | INTRAVENOUS | Status: DC
  Filled 2016-07-03 (×2): qty 1000

## 2016-07-03 NOTE — MAU Note (Signed)
Pt c/o contractions starting at 4am. Pt states baby is moving normally. Pt denies bleeding and leaking of fluid.

## 2016-07-03 NOTE — Anesthesia Pain Management Evaluation Note (Signed)
  CRNA Pain Management Visit Note  Patient: Vicki Tran, 30 y.o., female  "Hello I am a [facility] of the [facility]. We have an anesthesia team available at all times to provide care throughout the hospital, including epidural management and anesthesia for C-section. I don't know your plan for the delivery whether it a natural birth, water birth, IV sedation, nitrous supplementation, doula or epidural, but we want to meet your pain goals."   1.Was your pain managed to your expectations on prior hospitalizations?   No prior hospitalizations  2.What is your expectation for pain management during this hospitalization?   Natural birth with  IV pain meds  3.How can we help you reach that goal? Comfort measures, RN support and IV medications  Record the patient's initial score and the patient's pain goal.   Pain: 8  Pain Goal: 0-10, IV medication when necessary. [facility] wants you to be able to say your pain was always managed very well.  [facility],Idus Rathke 07/03/2016

## 2016-07-03 NOTE — Progress Notes (Signed)
LABOR PROGRESS NOTE  Vicki Tran is a 30 y.o. G1P0 at 713w3d  admitted for SOL/  Subjective: Doing well.   Objective: BP 120/75   Pulse 89   Temp 98 F (36.7 C) (Oral)   Resp 16   Ht 5\' 6"  (1.676 m)   Wt 159 lb (72.1 kg)   LMP 09/24/2015   SpO2 99%   BMI 25.66 kg/m  or  Vitals:   07/03/16 2001 07/03/16 2036 07/03/16 2101 07/03/16 2131  BP: 127/75 125/74 136/74 120/75  Pulse: 90 85 79 89  Resp: 18 16 18 16   Temp:      TempSrc:      SpO2:      Weight:      Height:        Patient lying in bed comfortably  Dilation: 1 Dilation Complete Date: 07/03/16 Dilation Complete Time: 1621 Effacement (%): 80 Cervical Position: Middle Station: -1 Presentation: Vertex Exam by:: Artist PaisYoo  FHT: baseline rare 125, moderate variability, +acel, no decel  Labs: Lab Results  Component Value Date   WBC 6.0 07/03/2016   HGB 13.5 07/03/2016   HCT 38.8 07/03/2016   MCV 84.5 07/03/2016   PLT 124 (L) 07/03/2016    Patient Active Problem List   Diagnosis Date Noted  . Pregnancy 07/03/2016    Assessment / Plan: 30 y.o. G1P0 at 6413w3d here for SOL/latent labor.  Labor: Foley bulb in place, continue pitocin  Fetal Wellbeing:  Category I Pain Control:  Well-controlled with IV fentanyl. May have epidural when in active labor Anticipated MOD:  SVD  Frederik PearJulie P Jacqeline Broers, MD 07/03/2016, 9:34 PM

## 2016-07-03 NOTE — H&P (Signed)
LABOR AND DELIVERY ADMISSION HISTORY AND PHYSICAL NOTE  Vicki Tran is a 30 y.o. female G1P0 with IUP at 5780w3d by 8 wk U/S presenting for SOL.   She reports positive fetal movement. She denies leakage of fluid or vaginal bleeding.   Prenatal History/Complications: none  Past Medical History: Past Medical History:  Diagnosis Date  . Depression 2007    Past Surgical History: Past Surgical History:  Procedure Laterality Date  . NO PAST SURGERIES      Obstetrical History: OB History    Gravida Para Term Preterm AB Living   1 0           SAB TAB Ectopic Multiple Live Births                  Social History: Social History   Social History  . Marital status: Divorced    Spouse name: N/A  . Number of children: N/A  . Years of education: N/A   Social History Main Topics  . Smoking status: Current Every Day Smoker    Packs/day: 0.50    Types: Cigarettes  . Smokeless tobacco: Never Used  . Alcohol use No  . Drug use: No  . Sexual activity: Yes   Other Topics Concern  . None   Social History Narrative  . None    Family History: Family History  Problem Relation Age of Onset  . Diabetes Maternal Aunt   . Diabetes Paternal Aunt     Allergies: No Known Allergies  Prescriptions Prior to Admission  Medication Sig Dispense Refill Last Dose  . Prenatal Vit-Fe Fumarate-FA (PRENATAL MULTIVITAMIN) TABS tablet Take 1 tablet by mouth daily.    Past Week at Unknown time     Review of Systems   All systems reviewed and negative except as stated in HPI  Blood pressure 115/70, pulse 92, temperature 97.7 F (36.5 C), temperature source Oral, resp. rate 20, height 5\' 6"  (1.676 m), weight 72.1 kg (159 lb), last menstrual period 09/24/2015, SpO2 99 %. General appearance: alert, cooperative and no distress Lungs: no respiratory distress Heart: regular rate Abdomen: soft, non-tender Extremities: No calf swelling or tenderness Presentation: cephalic by cervical  exam Fetal monitoring: 130 baseline, moderate variability, +accelerations, early decelerations Uterine activity: q1-594min Dilation: 1 Effacement (%): 70 Station: -2 Exam by:: cwicker,rnc   Prenatal labs: ABO, Rh: AB/Positive/-- (02/23 0000) Antibody: Negative (02/23 0000) Rubella: immune RPR: Nonreactive (02/23 0000)  HBsAg: Negative (02/23 0000)  HIV: Non Reactive (01/25 1724)  GBS: Positive (08/10 0000)  Genetic screening:  negative Anatomy US: normal  Prenatal Transfer Tool  Maternal Diabetes: No Genetic Screening: Normal Maternal Ultrasounds/Referrals: Normal Fetal Ultrasounds or other Referrals:  None Maternal Substance Abuse:  Yes:  Type: Smoker, Marijuana Significant Maternal Medications:  None Significant Maternal Lab Results: Lab values include: Group B Strep positive  Results for orders placed or performed during the hospital encounter of 07/03/16 (from the past 24 hour(s))  CBC   Collection Time: 07/03/16 12:55 PM  Result Value Ref Range   WBC 6.0 4.0 - 10.5 K/uL   RBC 4.59 3.87 - 5.11 MIL/uL   Hemoglobin 13.5 12.0 - 15.0 g/dL   HCT 82.938.8 56.236.0 - 13.046.0 %   MCV 84.5 78.0 - 100.0 fL   MCH 29.4 26.0 - 34.0 pg   MCHC 34.8 30.0 - 36.0 g/dL   RDW 86.514.9 78.411.5 - 69.615.5 %   Platelets 124 (L) 150 - 400 K/uL    Patient Active Problem List  Diagnosis Date Noted  . Pregnancy 07/03/2016    Assessment: Vicki Tran is a 30 y.o. G1P0 at [redacted]w[redacted]d here for SOL  #Labor: expectant management #Pain: fentanyl #FWB: Category I #ID:  GBS positive #MOF: breast #MOC:depo #Circ:  outpatient  Leland Her, DO PGY-1 9/11/20171:34 PM   OB FELLOW HISTORY AND PHYSICAL ATTESTATION  I have seen and examined this patient; I agree with above documentation in the resident's note.    Jen Mow, DO OB Fellow 07/03/2016, 6:55 PM

## 2016-07-03 NOTE — Progress Notes (Signed)
LABOR PROGRESS NOTE  Vicki Tran is a 30 y.o. G1P0 at 3210w3d  admitted for SOL  Subjective: Doing well, feeling uncomfortable with contractions but felt that fentanyl helped  Objective: BP 109/61   Pulse 88   Temp 97.7 F (36.5 C) (Oral)   Resp 20   Ht 5\' 6"  (1.676 m)   Wt 72.1 kg (159 lb)   LMP 09/24/2015   SpO2 99%   BMI 25.66 kg/m  or  Vitals:   07/03/16 1327 07/03/16 1720 07/03/16 1731 07/03/16 1800  BP:  136/73 126/65 109/61  Pulse:  85 86 88  Resp: 20 (!) 22 (!) 22 20  Temp:      TempSrc:      SpO2:      Weight: 72.1 kg (159 lb)     Height: 5\' 6"  (1.676 m)        Dilation: 1 Dilation Complete Date: 07/03/16 Dilation Complete Time: 1621 Effacement (%): 70 Cervical Position: Middle Station: -1 Presentation: Vertex Exam by:: Mumaw FHT: 125 baseline, moderate variability,  no accelerations, no decelerations Uterine activity: q3-229min  Labs: Lab Results  Component Value Date   WBC 6.0 07/03/2016   HGB 13.5 07/03/2016   HCT 38.8 07/03/2016   MCV 84.5 07/03/2016   PLT 124 (L) 07/03/2016    Patient Active Problem List   Diagnosis Date Noted  . Pregnancy 07/03/2016    Assessment / Plan: 30 y.o. G1P0 at 510w3d here for SOL  Labor: foley bulb placed, pitocin Fetal Wellbeing:  Category I Pain Control:  fentanyl Anticipated MOD:  SVD  Leland HerElsia J Zoraida Havrilla, DO PGY-1 9/11/20176:36 PM

## 2016-07-04 ENCOUNTER — Inpatient Hospital Stay (HOSPITAL_COMMUNITY): Payer: Medicaid Other | Admitting: Anesthesiology

## 2016-07-04 ENCOUNTER — Encounter (HOSPITAL_COMMUNITY): Payer: Self-pay | Admitting: Anesthesiology

## 2016-07-04 DIAGNOSIS — O99334 Smoking (tobacco) complicating childbirth: Secondary | ICD-10-CM

## 2016-07-04 DIAGNOSIS — Z3A40 40 weeks gestation of pregnancy: Secondary | ICD-10-CM

## 2016-07-04 DIAGNOSIS — O99824 Streptococcus B carrier state complicating childbirth: Secondary | ICD-10-CM

## 2016-07-04 LAB — RPR: RPR: NONREACTIVE

## 2016-07-04 MED ORDER — LIDOCAINE HCL (PF) 1 % IJ SOLN
INTRAMUSCULAR | Status: DC | PRN
  Administered 2016-07-04 (×2): 7 mL via EPIDURAL

## 2016-07-04 MED ORDER — ONDANSETRON HCL 4 MG/2ML IJ SOLN: 4.0000 mg | INTRAMUSCULAR | Status: DC | PRN

## 2016-07-04 MED ORDER — DIBUCAINE 1 % RE OINT: 1.0000 "application " | TOPICAL_OINTMENT | RECTAL | Status: DC | PRN

## 2016-07-04 MED ORDER — FENTANYL 2.5 MCG/ML BUPIVACAINE 1/10 % EPIDURAL INFUSION (WH - ANES)
14.0000 mL/h | INTRAMUSCULAR | Status: DC | PRN
  Administered 2016-07-04: 14 mL/h via EPIDURAL

## 2016-07-04 MED ORDER — DIPHENHYDRAMINE HCL 50 MG/ML IJ SOLN: 12.5000 mg | INTRAMUSCULAR | Status: DC | PRN

## 2016-07-04 MED ORDER — SIMETHICONE 80 MG PO CHEW: 80.0000 mg | CHEWABLE_TABLET | ORAL | Status: DC | PRN

## 2016-07-04 MED ORDER — MISOPROSTOL 200 MCG PO TABS
ORAL_TABLET | ORAL | Status: AC
  Filled 2016-07-04: qty 4

## 2016-07-04 MED ORDER — PHENYLEPHRINE 40 MCG/ML (10ML) SYRINGE FOR IV PUSH (FOR BLOOD PRESSURE SUPPORT)
80.0000 ug | PREFILLED_SYRINGE | INTRAVENOUS | Status: DC | PRN
  Filled 2016-07-04: qty 5

## 2016-07-04 MED ORDER — IBUPROFEN 600 MG PO TABS
600.0000 mg | ORAL_TABLET | Freq: Four times a day (QID) | ORAL | Status: DC
  Administered 2016-07-04 – 2016-07-05 (×5): 600 mg via ORAL
  Filled 2016-07-04 (×5): qty 1

## 2016-07-04 MED ORDER — EPHEDRINE 5 MG/ML INJ
10.0000 mg | INTRAVENOUS | Status: DC | PRN
  Filled 2016-07-04: qty 4

## 2016-07-04 MED ORDER — PRENATAL MULTIVITAMIN CH
1.0000 | ORAL_TABLET | Freq: Every day | ORAL | Status: DC
  Administered 2016-07-04 – 2016-07-05 (×2): 1 via ORAL
  Filled 2016-07-04 (×2): qty 1

## 2016-07-04 MED ORDER — ONDANSETRON HCL 4 MG PO TABS: 4.0000 mg | ORAL_TABLET | ORAL | Status: DC | PRN

## 2016-07-04 MED ORDER — SENNOSIDES-DOCUSATE SODIUM 8.6-50 MG PO TABS
2.0000 | ORAL_TABLET | ORAL | Status: DC
  Administered 2016-07-05: 2 via ORAL
  Filled 2016-07-04: qty 2

## 2016-07-04 MED ORDER — ACETAMINOPHEN 325 MG PO TABS
650.0000 mg | ORAL_TABLET | ORAL | Status: DC | PRN
  Administered 2016-07-04 – 2016-07-05 (×3): 650 mg via ORAL
  Filled 2016-07-04 (×3): qty 2

## 2016-07-04 MED ORDER — WITCH HAZEL-GLYCERIN EX PADS: 1.0000 "application " | MEDICATED_PAD | CUTANEOUS | Status: DC | PRN

## 2016-07-04 MED ORDER — COCONUT OIL OIL: 1.0000 "application " | TOPICAL_OIL | Status: DC | PRN

## 2016-07-04 MED ORDER — DIPHENHYDRAMINE HCL 25 MG PO CAPS: 25.0000 mg | ORAL_CAPSULE | Freq: Four times a day (QID) | ORAL | Status: DC | PRN

## 2016-07-04 MED ORDER — ZOLPIDEM TARTRATE 5 MG PO TABS: 5.0000 mg | ORAL_TABLET | Freq: Every evening | ORAL | Status: DC | PRN

## 2016-07-04 MED ORDER — INFLUENZA VAC SPLIT QUAD 0.5 ML IM SUSY: 0.5000 mL | PREFILLED_SYRINGE | INTRAMUSCULAR | Status: DC

## 2016-07-04 MED ORDER — TETANUS-DIPHTH-ACELL PERTUSSIS 5-2.5-18.5 LF-MCG/0.5 IM SUSP: 0.5000 mL | Freq: Once | INTRAMUSCULAR | Status: DC

## 2016-07-04 MED ORDER — BENZOCAINE-MENTHOL 20-0.5 % EX AERO
1.0000 "application " | INHALATION_SPRAY | CUTANEOUS | Status: DC | PRN
  Administered 2016-07-04: 1 via TOPICAL
  Filled 2016-07-04: qty 56

## 2016-07-04 MED ORDER — FENTANYL 2.5 MCG/ML BUPIVACAINE 1/10 % EPIDURAL INFUSION (WH - ANES)
INTRAMUSCULAR | Status: AC
  Filled 2016-07-04: qty 125

## 2016-07-04 MED ORDER — PHENYLEPHRINE 40 MCG/ML (10ML) SYRINGE FOR IV PUSH (FOR BLOOD PRESSURE SUPPORT)
PREFILLED_SYRINGE | INTRAVENOUS | Status: AC
  Filled 2016-07-04: qty 20

## 2016-07-04 MED ORDER — MISOPROSTOL 200 MCG PO TABS: 800.0000 ug | ORAL_TABLET | Freq: Once | ORAL | Status: DC

## 2016-07-04 MED ORDER — LACTATED RINGERS IV SOLN: 500.0000 mL | Freq: Once | INTRAVENOUS | Status: DC

## 2016-07-04 NOTE — Anesthesia Preprocedure Evaluation (Signed)
Anesthesia Evaluation  Patient identified by MRN, date of birth, ID band Patient awake    Reviewed: Allergy & Precautions, H&P , NPO status , Patient's Chart, lab work & pertinent test results  Airway Mallampati: I  TM Distance: >3 FB Neck ROM: full    Dental no notable dental hx.    Pulmonary neg pulmonary ROS, Current Smoker,    Pulmonary exam normal        Cardiovascular negative cardio ROS Normal cardiovascular exam     Neuro/Psych negative neurological ROS     GI/Hepatic negative GI ROS, Neg liver ROS,   Endo/Other  negative endocrine ROS  Renal/GU negative Renal ROS     Musculoskeletal   Abdominal Normal abdominal exam  (+)   Peds  Hematology negative hematology ROS (+)   Anesthesia Other Findings   Reproductive/Obstetrics (+) Pregnancy                             Anesthesia Physical Anesthesia Plan  ASA: II  Anesthesia Plan: Epidural   Post-op Pain Management:    Induction:   Airway Management Planned:   Additional Equipment:   Intra-op Plan:   Post-operative Plan:   Informed Consent: I have reviewed the patients History and Physical, chart, labs and discussed the procedure including the risks, benefits and alternatives for the proposed anesthesia with the patient or authorized representative who has indicated his/her understanding and acceptance.     Plan Discussed with:   Anesthesia Plan Comments:         Anesthesia Quick Evaluation  

## 2016-07-04 NOTE — Anesthesia Procedure Notes (Signed)
Epidural Patient location during procedure: OB Start time: 07/04/2016 12:56 AM End time: 07/04/2016 1:00 AM  Staffing Anesthesiologist: Leilani AbleHATCHETT, Juvencio Verdi Performed: anesthesiologist   Preanesthetic Checklist Completed: patient identified, surgical consent, pre-op evaluation, timeout performed, IV checked, risks and benefits discussed and monitors and equipment checked  Epidural Patient position: sitting Prep: site prepped and draped and DuraPrep Patient monitoring: continuous pulse ox and blood pressure Approach: midline Location: L3-L4 Injection technique: LOR air  Needle:  Needle type: Tuohy  Needle gauge: 17 G Needle length: 9 cm and 9 Needle insertion depth: 5 cm cm Catheter type: closed end flexible Catheter size: 19 Gauge Catheter at skin depth: 10 cm Test dose: negative and Other  Assessment Sensory level: T9 Events: blood not aspirated, injection not painful, no injection resistance, negative IV test and no paresthesia  Additional Notes Reason for block:procedure for pain

## 2016-07-04 NOTE — Anesthesia Postprocedure Evaluation (Signed)
Anesthesia Post Note  Patient: Vicki Tran  Procedure(s) Performed: * No procedures listed *  Patient location during evaluation: Mother Baby Anesthesia Type: Epidural Level of consciousness: awake and alert and oriented Pain management: satisfactory to patient Vital Signs Assessment: post-procedure vital signs reviewed and stable Respiratory status: spontaneous breathing and nonlabored ventilation Cardiovascular status: stable Postop Assessment: no headache, no backache, no signs of nausea or vomiting, adequate PO intake and patient able to bend at knees (patient up walking) Anesthetic complications: no     Last Vitals:  Vitals:   07/04/16 0845 07/04/16 1235  BP: 115/74 110/63  Pulse: 74 73  Resp: 18 18  Temp: 36.9 C 37.1 C    Last Pain:  Vitals:   07/04/16 1235  TempSrc: Oral  PainSc: 0-No pain   Pain Goal: Patients Stated Pain Goal: 4 (07/03/16 1044)               Madison HickmanGREGORY,Ariannie Penaloza

## 2016-07-04 NOTE — Progress Notes (Signed)
Labor Progress Note Vicki Tran is a 30 y.o. G1P0 at 2269w4d presented for IOL for decels  S:  Uncomfortable with ctx, requesting pain meds.  O:  BP 137/90   Pulse (!) 103   Temp 98.1 F (36.7 C) (Oral)   Resp 16   Ht 5\' 6"  (1.676 m)   Wt 72.1 kg (159 lb)   LMP 09/24/2015   SpO2 99%   BMI 25.66 kg/m  EFM: baseline 125 bpm/ mod variability/ + accels/ no decels  Toco: 2-3 SVE: 7/90/-1, AROM clear, scant Pitocin: 4 mu/min  A/P: 30 y.o. G1P0 5869w4d  1. Labor: active 2. FWB: Cat I 3. Pain: Analgesia/anesthesia prn Anticipate labor progression and SVD.  Donette LarryMelanie Deshanta Lady, CNM 12:30 AM

## 2016-07-04 NOTE — Progress Notes (Signed)
UR chart review completed.  

## 2016-07-04 NOTE — Lactation Note (Signed)
This note was copied from a baby's chart. Lactation Consultation Note: Mom resting when I went into room. Reports baby fed well for 20 min earlier this morning with no pain. Reviewed feeding cues and encouraged to feed whenever she sees them. Baby asleep in bassinet at this time. Encouraged to call for assist prn. BF brochure given. Reviewed OP appointments and BFSG as resources for support after DC. No questions at present.   Patient Name: Vicki Tran Today's Date: 07/04/2016 Reason for consult: Initial assessment   Maternal Data Formula Feeding for Exclusion: No Does the patient have breastfeeding experience prior to this delivery?: No  Feeding Feeding Type: Breast Milk  LATCH Score/Interventions Latch: Too sleepy or reluctant, no latch achieved, no sucking elicited. (few sucks) Intervention(s): Skin to skin  Audible Swallowing: None Intervention(s): Skin to skin;Hand expression  Type of Nipple: Everted at rest and after stimulation  Comfort (Breast/Nipple): Soft / non-tender     Hold (Positioning): Assistance needed to correctly position infant at breast and maintain latch. Intervention(s): Breastfeeding basics reviewed;Support Pillows;Skin to skin  LATCH Score: 5  Lactation Tools Discussed/Used     Consult Status Consult Status: Follow-up Date: 07/05/16 Follow-up type: In-patient    Vicki Tran, Vicki Tran 07/04/2016, 11:50 AM

## 2016-07-05 ENCOUNTER — Inpatient Hospital Stay (HOSPITAL_COMMUNITY): Admission: RE | Admit: 2016-07-05 | Payer: No Typology Code available for payment source | Source: Ambulatory Visit

## 2016-07-05 LAB — BIRTH TISSUE RECOVERY COLLECTION (PLACENTA DONATION)

## 2016-07-05 MED ORDER — ACETAMINOPHEN 325 MG PO TABS: 650.0000 mg | ORAL_TABLET | ORAL | 0 refills | Status: DC | PRN

## 2016-07-05 MED ORDER — IBUPROFEN 600 MG PO TABS: 600.0000 mg | ORAL_TABLET | Freq: Four times a day (QID) | ORAL | 0 refills | Status: DC

## 2016-07-05 NOTE — Discharge Summary (Signed)
OB Discharge Summary     Patient Name: Vicki Tran DOB: 06/22/1986 MRN: [id number]  Date of admission: 07/03/2016 Delivering MD: Donette LarryBHAMBRI, MELANIE   Date of discharge: 07/05/2016  Admitting diagnosis: LABOR Intrauterine pregnancy: 2177w4d     Secondary diagnosis:  Active Problems:   Pregnancy Spontaneous onset of labor at term Additional problems: none     Discharge diagnosis: Term Pregnancy Delivered                                                                                                Post partum procedures:none  Augmentation: AROM, Pitocin and Foley Balloon  Complications: None  Hospital course:  Onset of Labor With Vaginal Delivery     30 y.o. yo G1P1001 at 5877w4d was admitted in Latent Labor on 07/03/2016. Patient had an uncomplicated labor course as follows:  Membrane Rupture Time/Date: 12:24 AM ,07/04/2016   Intrapartum Procedures: Episiotomy: None [1]                                         Lacerations:  1st degree [2];Perineal [11];Periurethral [8]  Patient had a delivery of a Viable infant. 07/04/2016  Information for the patient's newborn:  Alwyn PeaKelly, Boy May [[id number]][[id number]]  Delivery Method: Vaginal, Spontaneous Delivery (Filed from Delivery Summary)    Pateint had an uncomplicated postpartum course.  She is ambulating, tolerating a regular diet, passing flatus, and urinating well. Patient is discharged home in stable condition on 07/05/16.    Physical exam  Vitals:   07/04/16 0845 07/04/16 1235 07/04/16 1836 07/05/16 0522  BP: 115/74 110/63 110/68 106/85  Pulse: 74 73 97 85  Resp: 18 18 18 18   Temp: 98.4 F (36.9 C) 98.7 F (37.1 C) 98.5 F (36.9 C) 98.2 F (36.8 C)  TempSrc: Oral Oral Oral Oral  SpO2:      Weight:      Height:       General: alert, cooperative and no distress Lochia: appropriate Uterine Fundus: firm Incision: N/A DVT Evaluation: No evidence of DVT seen on physical exam. No significant calf/ankle edema. Labs: Lab Results   Component Value Date   WBC 6.0 07/03/2016   HGB 13.5 07/03/2016   HCT 38.8 07/03/2016   MCV 84.5 07/03/2016   PLT 124 (L) 07/03/2016   CMP Latest Ref Rng & Units 08/12/2013  Glucose 70 - 99 mg/dL 75  BUN 6 - 23 mg/dL 6  Creatinine 7.820.50 - 9.561.10 mg/dL 2.130.90  Sodium 086135 - 578145 mEq/L 141  Potassium 3.5 - 5.1 mEq/L 3.9  Chloride 96 - 112 mEq/L 107  CO2 19 - 32 mEq/L -  Calcium 8.4 - 10.5 mg/dL -  Total Protein 6.0 - 8.3 g/dL -  Total Bilirubin 0.3 - 1.2 mg/dL -  Alkaline Phos 39 - 469117 U/L -  AST 0 - 37 U/L -  ALT 0 - 35 U/L -    Discharge instruction: per After Visit Summary and "Baby and Me Booklet".  After visit meds:    Medication  List    TAKE these medications   acetaminophen 325 MG tablet Commonly known as:  TYLENOL Take 2 tablets (650 mg total) by mouth every 4 (four) hours as needed (for pain scale < 4).   ibuprofen 600 MG tablet Commonly known as:  ADVIL,MOTRIN Take 1 tablet (600 mg total) by mouth every 6 (six) hours.   prenatal multivitamin Tabs tablet Take 1 tablet by mouth daily.       Diet: routine diet  Activity: Advance as tolerated. Pelvic rest for 6 weeks.   Outpatient follow up:6 weeks Follow up Appt:No future appointments. Follow up Visit:No Follow-up on file.  Postpartum contraception: Depo Provera  Newborn Data: Live born female  Birth Weight: 7 lb 15.5 oz (3615 g) APGAR: 9, 10  Baby Feeding: Breast Disposition:home with mother   07/05/2016 Ernestina Penna, MD

## 2016-07-05 NOTE — Discharge Instructions (Signed)

## 2016-07-05 NOTE — Progress Notes (Signed)
POSTPARTUM PROGRESS NOTE  Post Partum Day 1  Subjective:  Vicki Tran is a 30 y.o. G1P1001 7116w4d s/p SVD.  No acute events overnight.  Pt denies problems with ambulating, voiding or po intake.  She denies nausea or vomiting.  Pain is well controlled.  Lochia Moderate.   Objective: Blood pressure 106/85, pulse 85, temperature 98.2 F (36.8 C), temperature source Oral, resp. rate 18, height 5\' 6"  (1.676 m), weight 159 lb (72.1 kg), last menstrual period 09/24/2015, SpO2 98 %, unknown if currently breastfeeding.  Physical Exam:  General: alert, cooperative and no distress Lochia:normal flow Chest: no respiratory distress Heart:regular rate, distal pulses intact Abdomen: soft, nontender,  Uterine Fundus: firm, appropriately tender DVT Evaluation: No calf swelling or tenderness Extremities: no edema   Recent Labs  07/03/16 1255  HGB 13.5  HCT 38.8    Assessment/Plan: ASSESSMENT: Vicki Tran is a 30 y.o. G1P1001 4516w4d s/p SVD. Doing well. Breastfeeding. Will do DepoProvera for contraception. Baby boy will get circumcision outpatient  Plan for discharge tomorrow   LOS: 2 days   Kandra NicolasJulie P DegeleMD 07/05/2016, 7:37 AM   OB FELLOW POSTPARTUM PROGRESS NOTE ATTESTATION  I have seen and examined this patient and agree with above documentation in the resident's note.   Ernestina PennaNicholas Schenk, MD 7:52 AM

## 2016-07-05 NOTE — Lactation Note (Signed)
This note was copied from a baby's chart. Lactation Consultation Note  Patient Name: Vicki Tran UEAVW'UToday's Date: 07/05/2016 Reason for consult: Follow-up assessment Assisted Mom with positioning to obtain and sustain good depth with latch, baby latches well with wide open mouth but slips some during the feeding.  Baby demonstrates good suckling bursts off/on, few swallows noted. Mom has some aerola edema, demonstrated hand pump to use to pre-pump to help with swelling which may help baby sustain depth during the feeding. Worked on positions to help baby sustain good depth.  Advised baby should be at breast 8-12 times or more in 24 hours nursing for 15-20 minutes both breasts with some feedings. Cluster feeding discussed. Engorgement care reviewed if needed, advised of OP services and support group. Encouraged to call for questions/concerns.   Maternal Data    Feeding Feeding Type: Breast Fed Length of feed: 26 min  LATCH Score/Interventions Latch: Grasps breast easily, tongue down, lips flanged, rhythmical sucking.  Audible Swallowing: A few with stimulation  Type of Nipple: Everted at rest and after stimulation  Comfort (Breast/Nipple): Soft / non-tender     Hold (Positioning): Assistance needed to correctly position infant at breast and maintain latch. Intervention(s): Breastfeeding basics reviewed;Support Pillows;Position options;Skin to skin  LATCH Score: 8  Lactation Tools Discussed/Used Tools: Pump Breast pump type: Double-Electric Breast Pump   Consult Status Consult Status: Follow-up Date: 07/06/16 Follow-up type: In-patient    Alfred LevinsGranger, Lavona Norsworthy Ann 07/05/2016, 2:30 PM

## 2016-07-05 NOTE — Clinical Social Work Maternal (Signed)
  CLINICAL SOCIAL WORK MATERNAL/CHILD NOTE  Patient Details  Name: Vicki Tran MRN: [id number] Date of Birth: 1986-04-08  Date:  07/05/2016  Clinical Social Worker Initiating Note:  Laurey Arrow Date/ Time Initiated:  07/05/16/1418     Child's Name:  Vicki Tran   Legal Guardian:  Mother   Need for Interpreter:  None   Date of Referral:  07/05/16     Reason for Referral:  Current Substance Use/Substance Use During Pregnancy    Referral Source:  [facility]   Address:  [place] [address]  Phone number:  [phone]   Household Members:  Self, Parents   Natural Supports (not living in the home):  Extended Family, Immediate Family, Parent   Professional Supports: None   Employment: Unemployed   Type of Work:     Education:  9 to 11 years   Museum/gallery curator Resources:  [employer]   Other Resources:  [employer], Physicist, medical    Cultural/Religious Considerations Which May Impact Care:  Per [employer] Presenter, broadcasting, MOB is [employer].  Strengths:  Ability to meet basic needs , Pediatrician chosen , Home prepared for child    Risk Factors/Current Problems:  Substance Use    Cognitive State:  Able to Concentrate , Alert , Linear Thinking    Mood/Affect:  Flat , Interested , Relaxed    CSW Assessment: CSW met with MOB to complete an assessment for hx of substance use during pregnancy.  MOB affect was flat, however, MOB was polite and inviting.  MOB requested MOB's mother Corrie Dandy) to stay in the room during the meeting with CSW.  CSW inquired about MOB's SA hx and MOB acknowledged the use of marijuana during pregnancy.  MOB reported MOB's last use was May 2017.  CSW thanked MOB for being honest and informed MOB of the hospital's drug screen policy regarding substance use.  MOB was informed of the 2 screenings for the infant.  MOB was understanding and had no concerns. CSW explained to MOB that the UDS was negative and CSW will continue to monitor the infant's  cord. CSW made MOB aware that if infant's Cord Screen is positive without an explanation, CSW will make a report to [facility] CPS. MOB stated that MOB no longer use marijuana and was understanding of the hospital's policy. CSW educated MOB and MOB's mother about PPD and SIDS.  CSW thanked MOB for meeting with CSW and encouraged MOB to contact CSW if MOB had any additional questions or concerns.  CSW Plan/Description:  Patient/Family Education , No Further Intervention Required/No Barriers to Discharge (CSW will follow infant's cord and will make a report to [place] is warrnated. )   Laurey Arrow, MSW, LCSW Clinical Social Work [phone]   Dimple Nanas, LCSW 07/05/2016, 2:21 PM

## 2016-07-13 ENCOUNTER — Encounter (HOSPITAL_COMMUNITY): Payer: Self-pay | Admitting: *Deleted

## 2016-07-13 DIAGNOSIS — O9122 Nonpurulent mastitis associated with the puerperium: Secondary | ICD-10-CM | POA: Diagnosis not present

## 2016-07-13 DIAGNOSIS — Z833 Family history of diabetes mellitus: Secondary | ICD-10-CM | POA: Insufficient documentation

## 2016-07-13 DIAGNOSIS — Z791 Long term (current) use of non-steroidal anti-inflammatories (NSAID): Secondary | ICD-10-CM | POA: Diagnosis not present

## 2016-07-13 DIAGNOSIS — A419 Sepsis, unspecified organism: Secondary | ICD-10-CM | POA: Diagnosis not present

## 2016-07-13 DIAGNOSIS — Z8659 Personal history of other mental and behavioral disorders: Secondary | ICD-10-CM | POA: Insufficient documentation

## 2016-07-13 DIAGNOSIS — O99335 Smoking (tobacco) complicating the puerperium: Secondary | ICD-10-CM | POA: Insufficient documentation

## 2016-07-13 LAB — COMPREHENSIVE METABOLIC PANEL
ALK PHOS: 93 U/L (ref 38–126)
ALT: 25 U/L (ref 14–54)
AST: 21 U/L (ref 15–41)
Albumin: 3.1 g/dL — ABNORMAL LOW (ref 3.5–5.0)
Anion gap: 11 (ref 5–15)
BUN: 9 mg/dL (ref 6–20)
CALCIUM: 9.8 mg/dL (ref 8.9–10.3)
CO2: 22 mmol/L (ref 22–32)
CREATININE: 0.9 mg/dL (ref 0.44–1.00)
Chloride: 102 mmol/L (ref 101–111)
Glucose, Bld: 93 mg/dL (ref 65–99)
Potassium: 3.9 mmol/L (ref 3.5–5.1)
Sodium: 135 mmol/L (ref 135–145)
Total Bilirubin: 0.6 mg/dL (ref 0.3–1.2)
Total Protein: 6.5 g/dL (ref 6.5–8.1)

## 2016-07-13 LAB — CBC WITH DIFFERENTIAL/PLATELET
BASOS ABS: 0 10*3/uL (ref 0.0–0.1)
Basophils Relative: 0 %
Eosinophils Absolute: 0 10*3/uL (ref 0.0–0.7)
Eosinophils Relative: 0 %
HEMATOCRIT: 40.1 % (ref 36.0–46.0)
Hemoglobin: 13.2 g/dL (ref 12.0–15.0)
Lymphocytes Relative: 8 %
Lymphs Abs: 0.9 10*3/uL (ref 0.7–4.0)
MCH: 28.6 pg (ref 26.0–34.0)
MCHC: 32.9 g/dL (ref 30.0–36.0)
MCV: 87 fL (ref 78.0–100.0)
Monocytes Absolute: 0.3 10*3/uL (ref 0.1–1.0)
Monocytes Relative: 3 %
NEUTROS ABS: 9.7 10*3/uL — AB (ref 1.7–7.7)
Neutrophils Relative %: 89 %
Platelets: 251 10*3/uL (ref 150–400)
RBC: 4.61 MIL/uL (ref 3.87–5.11)
RDW: 13.9 % (ref 11.5–15.5)
WBC: 10.9 10*3/uL — ABNORMAL HIGH (ref 4.0–10.5)

## 2016-07-13 LAB — I-STAT CG4 LACTIC ACID, ED: Lactic Acid, Venous: 1.34 mmol/L (ref 0.5–1.9)

## 2016-07-13 LAB — I-STAT BETA HCG BLOOD, ED (MC, WL, AP ONLY)

## 2016-07-13 MED ORDER — ACETAMINOPHEN 325 MG PO TABS
ORAL_TABLET | ORAL | Status: AC
  Filled 2016-07-13: qty 2

## 2016-07-13 MED ORDER — SODIUM CHLORIDE 0.9 % IV SOLN
Freq: Once | INTRAVENOUS | Status: AC
  Administered 2016-07-13: 22:00:00 via INTRAVENOUS

## 2016-07-13 MED ORDER — ACETAMINOPHEN 325 MG PO TABS
650.0000 mg | ORAL_TABLET | Freq: Once | ORAL | Status: AC | PRN
  Administered 2016-07-13: 650 mg via ORAL

## 2016-07-13 NOTE — ED Triage Notes (Signed)
Pt c/o chilles, bodyaches, and migraine onset at 2pm. Pt is currently breastfeeding and has had difficulty feeding from R breast. Redness and swelling noted to breast

## 2016-07-13 NOTE — ED Notes (Signed)
Pt is two weeks postpartum

## 2016-07-14 ENCOUNTER — Observation Stay (HOSPITAL_COMMUNITY)
Admission: EM | Admit: 2016-07-14 | Discharge: 2016-07-14 | Disposition: A | Discharge status: Home / Self Care | Payer: Medicaid Other | Attending: Internal Medicine | Admitting: Internal Medicine

## 2016-07-14 DIAGNOSIS — Z720 Tobacco use: Secondary | ICD-10-CM

## 2016-07-14 DIAGNOSIS — N61 Mastitis without abscess: Secondary | ICD-10-CM

## 2016-07-14 DIAGNOSIS — A419 Sepsis, unspecified organism: Secondary | ICD-10-CM

## 2016-07-14 LAB — URINALYSIS, ROUTINE W REFLEX MICROSCOPIC
BILIRUBIN URINE: NEGATIVE
GLUCOSE, UA: NEGATIVE mg/dL
HGB URINE DIPSTICK: NEGATIVE
KETONES UR: 40 mg/dL — AB
Nitrite: NEGATIVE
PH: 5.5 (ref 5.0–8.0)
Protein, ur: NEGATIVE mg/dL
Specific Gravity, Urine: 1.013 (ref 1.005–1.030)

## 2016-07-14 LAB — URINE MICROSCOPIC-ADD ON

## 2016-07-14 LAB — I-STAT CG4 LACTIC ACID, ED: Lactic Acid, Venous: 0.63 mmol/L (ref 0.5–1.9)

## 2016-07-14 MED ORDER — ONDANSETRON HCL 4 MG/2ML IJ SOLN: 4.0000 mg | Freq: Three times a day (TID) | INTRAMUSCULAR | Status: DC | PRN

## 2016-07-14 MED ORDER — SODIUM CHLORIDE 0.9 % IV BOLUS (SEPSIS)
1000.0000 mL | Freq: Once | INTRAVENOUS | Status: AC
  Administered 2016-07-14: 1000 mL via INTRAVENOUS

## 2016-07-14 MED ORDER — SODIUM CHLORIDE 0.9 % IV SOLN
INTRAVENOUS | Status: DC
  Administered 2016-07-14: 10:00:00 via INTRAVENOUS

## 2016-07-14 MED ORDER — BISACODYL 5 MG PO TBEC: 5.0000 mg | DELAYED_RELEASE_TABLET | Freq: Every day | ORAL | Status: DC | PRN

## 2016-07-14 MED ORDER — POLYETHYLENE GLYCOL 3350 17 G PO PACK: 17.0000 g | PACK | Freq: Every day | ORAL | Status: DC | PRN

## 2016-07-14 MED ORDER — IBUPROFEN 400 MG PO TABS: 400.0000 mg | ORAL_TABLET | Freq: Four times a day (QID) | ORAL | Status: DC | PRN

## 2016-07-14 MED ORDER — ACETAMINOPHEN 650 MG RE SUPP: 650.0000 mg | Freq: Four times a day (QID) | RECTAL | Status: DC | PRN

## 2016-07-14 MED ORDER — VANCOMYCIN HCL IN DEXTROSE 1-5 GM/200ML-% IV SOLN
1000.0000 mg | Freq: Once | INTRAVENOUS | Status: AC
  Administered 2016-07-14: 1000 mg via INTRAVENOUS
  Filled 2016-07-14: qty 200

## 2016-07-14 MED ORDER — SODIUM CHLORIDE 0.9 % IV BOLUS (SEPSIS): 1000.0000 mL | INTRAVENOUS | Status: DC

## 2016-07-14 MED ORDER — DEXTROSE 5 % IV SOLN
1.0000 g | Freq: Once | INTRAVENOUS | Status: AC
  Administered 2016-07-14: 1 g via INTRAVENOUS
  Filled 2016-07-14: qty 10

## 2016-07-14 MED ORDER — VANCOMYCIN HCL IN DEXTROSE 1-5 GM/200ML-% IV SOLN: 1000.0000 mg | Freq: Three times a day (TID) | INTRAVENOUS | Status: DC

## 2016-07-14 MED ORDER — ACETAMINOPHEN 325 MG PO TABS: 650.0000 mg | ORAL_TABLET | Freq: Four times a day (QID) | ORAL | Status: DC | PRN

## 2016-07-14 MED ORDER — HEPARIN SODIUM (PORCINE) 5000 UNIT/ML IJ SOLN
5000.0000 [IU] | Freq: Three times a day (TID) | INTRAMUSCULAR | Status: DC
  Administered 2016-07-14: 5000 [IU] via SUBCUTANEOUS
  Filled 2016-07-14: qty 1

## 2016-07-14 MED ORDER — SODIUM CHLORIDE 0.9 % IV BOLUS (SEPSIS)
2000.0000 mL | INTRAVENOUS | Status: AC
  Administered 2016-07-14: 2000 mL via INTRAVENOUS

## 2016-07-14 MED ORDER — PIPERACILLIN-TAZOBACTAM 3.375 G IVPB 30 MIN
3.3750 g | Freq: Once | INTRAVENOUS | Status: AC
  Administered 2016-07-14: 3.375 g via INTRAVENOUS
  Filled 2016-07-14: qty 50

## 2016-07-14 MED ORDER — OXYCODONE-ACETAMINOPHEN 5-325 MG PO TABS
1.0000 | ORAL_TABLET | Freq: Four times a day (QID) | ORAL | Status: DC | PRN
  Administered 2016-07-14: 1 via ORAL
  Filled 2016-07-14: qty 1

## 2016-07-14 MED ORDER — OXYCODONE-ACETAMINOPHEN 5-325 MG PO TABS: 1.0000 | ORAL_TABLET | Freq: Four times a day (QID) | ORAL | 0 refills | Status: DC | PRN

## 2016-07-14 MED ORDER — TRAZODONE HCL 50 MG PO TABS: 25.0000 mg | ORAL_TABLET | Freq: Every evening | ORAL | Status: DC | PRN

## 2016-07-14 MED ORDER — AMOXICILLIN-POT CLAVULANATE 875-125 MG PO TABS: 1.0000 | ORAL_TABLET | Freq: Two times a day (BID) | ORAL | 0 refills | Status: DC

## 2016-07-14 MED ORDER — PIPERACILLIN-TAZOBACTAM 3.375 G IVPB: 3.3750 g | Freq: Three times a day (TID) | INTRAVENOUS | Status: DC

## 2016-07-14 MED ORDER — SODIUM CHLORIDE 0.9 % IV BOLUS (SEPSIS)
250.0000 mL | Freq: Once | INTRAVENOUS | Status: AC
  Administered 2016-07-14: 250 mL via INTRAVENOUS

## 2016-07-14 NOTE — ED Notes (Signed)
Pt given water to drink per Dr. Juleen ChinaKohut

## 2016-07-14 NOTE — Progress Notes (Signed)
Pharmacy Antibiotic Note  Richelle Itoicole Eckenrode is a 30 y.o. female admitted on 07/14/2016 with mastitis/sepsis.  Pharmacy has been consulted for Vancomycin and Zosyn dosing.  Vancomycin 1 g IV given in ED at  0530  Plan: Vancomycin 1 g IV q8h Zosyn 3.375 g IV q8h    Temp (24hrs), Avg:100.4 F (38 C), Min:99.6 F (37.6 C), Max:101.5 F (38.6 C)   Recent Labs Lab 07/13/16 2130 07/13/16 2202 07/14/16 0509  WBC 10.9*  --   --   CREATININE 0.90  --   --   LATICACIDVEN  --  1.34 0.63    Estimated Creatinine Clearance: 91.2 mL/min (by C-G formula based on SCr of 0.9 mg/dL).    No Known Allergies   Eddie Candlebbott, Hether Anselmo Vernon 07/14/2016 7:39 AM

## 2016-07-14 NOTE — Consult Note (Signed)
Lactation consult with this mom of a term baby, now 762 weeks old. [facility] right [facility] mast[facility]. Lactation was called by Willette ClusterPaula Guenther, NP internal medicine.  On exam,mom's right breast is swollen, red from 6 o'clock to back to 1 o'clock, very tender to touch, and  Mom with  a 7 out of 10 pain level.  Mom was given pain medicine and ice packs for her breast, and within 15 minutes, mom was much more alert and states she was feeling better. I set up a symphony DEP for mom, and had her pump both breasts. Mom has a very good milk supply, and easily expressed milk from  her left breast, and much slower but steady from her right. She was able to express abpout 2-2 1/2 ounces from her right brest. Mom states she feels much better, breast still appears swollen, but is much less tender and much softer.  Mom is active with  WIC and loaned a DEP For 12 days. I will fax WIC about mom's need for a pump [facility]  Mom is being discharged to home, and will continue to breast feed her baby with cues.  Mom will use DEP if   baby  not softening her breast, or it is too painful to latch the baby.  Mom knows to call lactation for questions/concernzs.

## 2016-07-14 NOTE — Progress Notes (Signed)
Spoke with Lactation Specialist who will see patient but in the interim recommends the following:  Needs hospital grade pump and needs to pump both breasts at same time Q 2 hours. Warm compresses to right breast just prior to pumping then cold compresses in between. She can use / save the mild.   I called Peds, Hospital has [facility] pumps. Adrena in Peds will fill out A11 form to get breast pump kit for patient.

## 2016-07-14 NOTE — Discharge Instructions (Signed)
Follow with Primary MD in 7 days   Get CBC, CMP,  checked  by Primary MD next visit.    Activity: As tolerated    Disposition Home    Diet: Regular diet    On your next visit with your primary care physician please Get Medicines reviewed and adjusted.   Please request your Prim.MD to go over all Hospital Tests and Procedure/Radiological results at the follow up, please get all Hospital records sent to your Prim MD by signing hospital release before you go home.   If you experience worsening of your admission symptoms, develop shortness of breath, life threatening emergency, suicidal or homicidal thoughts you must seek medical attention immediately by calling 911 or calling your MD immediately  if symptoms less severe.  You Must read complete instructions/literature along with all the possible adverse reactions/side effects for all the Medicines you take and that have been prescribed to you. Take any new Medicines after you have completely understood and accpet all the possible adverse reactions/side effects.   Do not drive, operating heavy machinery, perform activities at heights, swimming or participation in water activities or provide baby sitting services if your were admitted for syncope or siezures until you have seen by Primary MD or a Neurologist and advised to do so again.  Do not drive when taking Pain medications.    Do not take more than prescribed Pain, Sleep and Anxiety Medications  Special Instructions: If you have smoked or chewed Tobacco  in the last 2 yrs please stop smoking, stop any regular Alcohol  and or any Recreational drug use.  Wear Seat belts while driving.   Please note  You were cared for by a hospitalist during your hospital stay. If you have any questions about your discharge medications or the care you received while you were in the hospital after you are discharged, you can call the unit and asked to speak with the hospitalist on call if the  hospitalist that took care of you is not available. Once you are discharged, your primary care physician will handle any further medical issues. Please note that NO REFILLS for any discharge medications will be authorized once you are discharged, as it is imperative that you return to your primary care physician (or establish a relationship with a primary care physician if you do not have one) for your aftercare needs so that they can reassess your need for medications and monitor your lab values.    Sepsis, Adult Sepsis is a serious infection of your blood or tissues that affects your whole body. The infection that causes sepsis may be bacterial, viral, fungal, or parasitic. Sepsis may be life threatening. Sepsis can cause your blood pressure to drop. This may result in shock. Shock causes your central nervous system and your organs to stop working correctly.  RISK FACTORS Sepsis can happen in anyone, but it is more likely to happen in people who have weakened immune systems. SIGNS AND SYMPTOMS  Symptoms of sepsis can include:  Fever or low body temperature (hypothermia).  Rapid breathing (hyperventilation).  Chills.  Rapid heartbeat (tachycardia).  Confusion or light-headedness.  Trouble breathing.  Urinating much less than usual.  Cool, clammy skin or red, flushed skin.  Other problems with the heart, kidneys, or brain. DIAGNOSIS  Your health care provider will likely do tests to look for an infection, to see if the infection has spread to your blood, and to see how serious your condition is. Tests can include:  Blood tests, including cultures of your blood.  Cultures of other fluids from your body, such as:  Urine.  Pus from wounds.  Mucus coughed up from your lungs.  Urine tests other than cultures.  X-ray exams or other imaging tests. TREATMENT  Treatment will begin with elimination of the source of infection. If your sepsis is likely caused by a bacterial or fungal  infection, you will be given antibiotic or antifungal medicines. You may also receive:  Oxygen.  Fluids through an IV tube.  Medicines to increase your blood pressure.  A machine to clean your blood (dialysis) if your kidneys fail.  A machine to help you breathe if your lungs fail. SEEK IMMEDIATE MEDICAL CARE IF: You get an infection or develop any of the signs and symptoms of sepsis after surgery or a hospitalization.   This information is not intended to replace advice given to you by your health care provider. Make sure you discuss any questions you have with your health care provider.   Document Released: 07/08/2003 Document Revised: 02/23/2015 Document Reviewed: 06/16/2013 Elsevier Interactive Patient Education [employer]

## 2016-07-14 NOTE — ED Provider Notes (Signed)
[facility]-EMERGENCY DEPT Provider Note   CSN: [id number] Arrival date & time: 07/13/16  2101     History   Chief Complaint Chief Complaint  Patient presents with  . Fever    HPI Vicki Tran is a 30 y.o. female with no major medical problems presents to the Emergency Department complaining of gradual, persistent, progressively worsening right breast pain, swelling and erythema onset 2pm yesterday along with a fever and myalgias.  Pt is post partum via vaginal delivery on 07/04/16 and breastfeeding. No treatments PTA.  No known aggravating or alleviating factors.  Pt reports she attempted to pump the right breast, but was unable to express any milk.   Pt denies headache, neck pain, chest pain, SOB, abd pain, N/V/D, weakness, syncope.      The history is provided by the patient and medical records. No language interpreter was used.    Past Medical History:  Diagnosis Date  . Depression 2007    Patient Active Problem List   Diagnosis Date Noted  . Sepsis (HCC) 07/14/2016  . Pregnancy 07/03/2016    Past Surgical History:  Procedure Laterality Date  . NO PAST SURGERIES      OB History    Gravida Para Term Preterm AB Living   1 1 1     1    SAB TAB Ectopic Multiple Live Births         0 1       Home Medications    Prior to Admission medications   Medication Sig Start Date End Date Taking? Authorizing Provider  Prenatal Vit-Fe Fumarate-FA (PRENATAL MULTIVITAMIN) TABS tablet Take 1 tablet by mouth daily.    Yes Historical Provider, MD  acetaminophen (TYLENOL) 325 MG tablet Take 2 tablets (650 mg total) by mouth every 4 (four) hours as needed (for pain scale < 4). Patient not taking: Reported on 07/14/2016 07/05/16   Lorne Skeens, MD  ibuprofen (ADVIL,MOTRIN) 600 MG tablet Take 1 tablet (600 mg total) by mouth every 6 (six) hours. 07/05/16   Lorne Skeens, MD    Family History Family History  Problem Relation Age of Onset  . Diabetes Maternal Aunt   .  Diabetes Paternal Aunt     Social History Social History  Substance Use Topics  . Smoking status: Current Every Day Smoker    Packs/day: 0.50    Types: Cigarettes  . Smokeless tobacco: Never Used  . Alcohol use No     Allergies   Review of patient's allergies indicates no known allergies.   Review of Systems Review of Systems  Constitutional: Positive for fever.  Musculoskeletal: Positive for myalgias.  Skin: Positive for color change ( right breast).  All other systems reviewed and are negative.    Physical Exam Updated Vital Signs BP 107/67   Pulse 102   Temp 100 F (37.8 C) (Oral)   Resp 26   SpO2 99%   Breastfeeding? Yes   Physical Exam  Constitutional: She appears well-developed and well-nourished. No distress.  Awake, alert, nontoxic appearance  HENT:  Head: Normocephalic and atraumatic.  Mouth/Throat: Oropharynx is clear and moist. No oropharyngeal exudate.  Eyes: Conjunctivae are normal. No scleral icterus.  Neck: Normal range of motion. Neck supple.  Cardiovascular: Regular rhythm and intact distal pulses.  Tachycardia present.   Pulses:      Radial pulses are 2+ on the right side, and 2+ on the left side.  Pulmonary/Chest: Effort normal and breath sounds normal. No respiratory distress. She has no  wheezes. Right breast exhibits skin change and tenderness. There is breast swelling.    Equal chest expansion  Abdominal: Soft. Bowel sounds are normal. She exhibits no mass. There is no tenderness. There is no rebound and no guarding.  Musculoskeletal: Normal range of motion. She exhibits no edema.  Neurological: She is alert.  Speech is clear and goal oriented Moves extremities without ataxia  Skin: Skin is warm and dry. She is not diaphoretic.  Hot to the touch  Psychiatric: She has a normal mood and affect.  Nursing note and vitals reviewed.    ED Treatments / Results  Labs (all labs ordered are listed, but only abnormal results are  displayed) Labs Reviewed  COMPREHENSIVE METABOLIC PANEL - Abnormal; Notable for the following:       Result Value   Albumin 3.1 (*)    All other components within normal limits  CBC WITH DIFFERENTIAL/PLATELET - Abnormal; Notable for the following:    WBC 10.9 (*)    Neutro Abs 9.7 (*)    All other components within normal limits  URINALYSIS, ROUTINE W REFLEX MICROSCOPIC (NOT [facility]) - Abnormal; Notable for the following:    Ketones, ur 40 (*)    Leukocytes, UA TRACE (*)    All other components within normal limits  URINE MICROSCOPIC-ADD ON - Abnormal; Notable for the following:    Squamous Epithelial / LPF 0-5 (*)    Bacteria, UA RARE (*)    All other components within normal limits  CULTURE, BLOOD (ROUTINE X 2)  CULTURE, BLOOD (ROUTINE X 2)  URINE CULTURE  I-STAT BETA HCG BLOOD, ED ([facility], [facility], [facility] ONLY)  I-STAT CG4 LACTIC ACID, ED  I-STAT CG4 LACTIC ACID, ED  I-STAT CG4 LACTIC ACID, ED    EKG  EKG Interpretation None       Radiology No results found.  Procedures Procedures (including critical care time)  CRITICAL CARE Performed by: Dierdre ForthMuthersbaugh, Okey Zelek Total critical care time: 45 minutes Critical care time was exclusive of separately billable procedures and treating other patients. Critical care was necessary to treat or prevent imminent or life-threatening deterioration. Critical care was time spent personally by me on the following activities: development of treatment plan with patient and/or surrogate as well as nursing, discussions with consultants, evaluation of patient's response to treatment, examination of patient, obtaining history from patient or surrogate, ordering and performing treatments and interventions, ordering and review of laboratory studies, ordering and review of radiographic studies, pulse oximetry and re-evaluation of patient's condition.   Medications Ordered in ED Medications  acetaminophen (TYLENOL) tablet 650 mg (650 mg Oral Given 07/13/16  2135)  0.9 %  sodium chloride infusion ( Intravenous Stopped 07/14/16 0022)  sodium chloride 0.9 % bolus 2,000 mL (0 mLs Intravenous Stopped 07/14/16 0524)  cefTRIAXone (ROCEPHIN) 1 g in dextrose 5 % 50 mL IVPB (0 g Intravenous Stopped 07/14/16 0449)  sodium chloride 0.9 % bolus 1,000 mL (0 mLs Intravenous Stopped 07/14/16 0554)    And  sodium chloride 0.9 % bolus 1,000 mL (1,000 mLs Intravenous New Bag/Given 07/14/16 0524)    And  sodium chloride 0.9 % bolus 250 mL (250 mLs Intravenous New Bag/Given 07/14/16 0611)  vancomycin (VANCOCIN) IVPB 1000 mg/200 mL premix (1,000 mg Intravenous New Bag/Given 07/14/16 0525)  piperacillin-tazobactam (ZOSYN) IVPB 3.375 g (0 g Intravenous Stopped 07/14/16 0555)     Initial Impression / Assessment and Plan / ED Course  I have reviewed the triage vital signs and the nursing notes.  Pertinent  labs & imaging results that were available during my care of the patient were reviewed by me and considered in my medical decision making (see chart for details).  Clinical Course  Value Comment By Time  WBC: (!) 10.9 leukocytosis Dollie Bressi, PA-C 09/22 0200  Leukocytes, UA: (!) TRACE No evidence of UTI Dierdre Forth, PA-C 09/22 0315  Lactic Acid, Venous: 0.63 WNL Travon Crochet, PA-C 09/22 0510   Pt continues to be hypotensive and tachycardic in spite of > 3 liters of fluid.  She has received IV clindamycin, vancomycin and Zosyn. Dahlia Client Sarai January, PA-C 09/22 0530   Pt admitted to stepdown by Dr. Radene Ou Laronn Devonshire, PA-C 09/22 [phone]   Pt With mastitis. Right breast is indurated and erythematous. No area of fluctuance to suggest superficial abscess. Patient is septic and this continues to be borderline hypotensive in spite of fluids. Will be admitted to stepdown for further antibiotics. Formal breast ultrasound ordered.  She has no chest pain or shortness of breath to suggest PE.  Final Clinical Impressions(s) / ED Diagnoses   Final  diagnoses:  Sepsis, due to unspecified organism (HCC)  Mastitis, acute    New Prescriptions New Prescriptions   No medications on file     Dierdre Forth, PA-C 07/14/16 0639    Layla Maw Ward, DO 07/14/16 [phone]

## 2016-07-14 NOTE — H&P (Signed)
History and Physical    Vicki Tran RUE:[id number] DOB: February 15, 1986 DOA: 07/14/2016  PCP: No PCP Per Patient  Patient coming from:   Home   Chief Complaint:  Right breast pain  HPI: Vicki Tran is a 30 y.o. female with no significant PMH, just depression for which she is not currently being treated. Patient is 2 weeks postpartum, first baby. She had been breastfeeding without problems but then yesterday developed pain and swelling in right breast associated with fever and myalgias. She tried to pump the affected breast but didn't get any mild. Patient believes she wasn't emptying her breast adequately and this led to infections. No urinary symptoms. No cough   ED Course:   Presenting tip 1 to 1.5, now down to 100. Presenting heart rate 143, down to 106. Patient is normotensive, normal oxygen saturation on room air. Lactic acid normal times 2, chem profile unremarkable. WBC 10.9, CBC otherwise unremarkable. Urinalysis shows 40 ketones, otherwise unremarkable EKG shows sinus tachycardia Code sepsis called  Review of Systems: As per HPI, otherwise 10 point review of systems negative.   Past Medical History:  Diagnosis Date  . Depression 2007    Past Surgical History:  Procedure Laterality Date  . NO PAST SURGERIES      Social History   Social History  . Marital status: Divorced    Spouse name: N/A  . Number of children: N/A  . Years of education: N/A   Occupational History  . Not on file.   Social History Main Topics  . Smoking status: Current Every Day Smoker    Packs/day: 0.50    Types: Cigarettes  . Smokeless tobacco: Never Used  . Alcohol use No  . Drug use: No  . Sexual activity: Yes   Other Topics Concern  . Not on file   Social History Narrative  . No narrative on file   Lives at home with mother. No assistive devices needed for ambulation. Patient smokes. No alcohol use.  No Known Allergies  Family History  Problem Relation Age of Onset  . Diabetes  Maternal Aunt   . Diabetes Paternal Aunt     Prior to Admission medications   Medication Sig Start Date End Date Taking? Authorizing Provider  Prenatal Vit-Fe Fumarate-FA (PRENATAL MULTIVITAMIN) TABS tablet Take 1 tablet by mouth daily.    Yes Historical Provider, MD  acetaminophen (TYLENOL) 325 MG tablet Take 2 tablets (650 mg total) by mouth every 4 (four) hours as needed (for pain scale < 4). Patient not taking: Reported on 07/14/2016 07/05/16   Lorne Skeens, MD  ibuprofen (ADVIL,MOTRIN) 600 MG tablet Take 1 tablet (600 mg total) by mouth every 6 (six) hours. 07/05/16   Lorne Skeens, MD    Physical Exam: Vitals:   07/14/16 0500 07/14/16 0530 07/14/16 0610 07/14/16 0700  BP: 109/56 110/70 107/67 99/62  Pulse: 99 104 102 100  Resp: 24 10 26  (!) 29  Temp:      TempSrc:      SpO2: 99% 99% 99% 99%    Constitutional:  Well developed black female in NAD, calm, comfortable Vitals:   07/14/16 0500 07/14/16 0530 07/14/16 0610 07/14/16 0700  BP: 109/56 110/70 107/67 99/62  Pulse: 99 104 102 100  Resp: 24 10 26  (!) 29  Temp:      TempSrc:      SpO2: 99% 99% 99% 99%   Eyes: PER, lids and conjunctivae normal ENMT: Mucous membranes are moist. Posterior pharynx clear of any  exudate or lesions..  Neck: normal, supple, no masses Respiratory: clear to auscultation bilaterally, no wheezing, no crackles. Normal respiratory effort. No accessory muscle use.  Cardiovascular: sinus tachycardia. No extremity edema. 2+ dorsal pedis pulses.   Right breast. Bottle half of right mildly erythematous (semi circular fashion) [place] is is edematous. Breast is firm compared to left. Milk discharged from right nipple after just slight palpation of the right breast. Abdomen: no tenderness, no masses palpated. No hepatomegaly. Bowel sounds positive.  Musculoskeletal: no clubbing / cyanosis. No joint deformity upper and lower extremities. Good ROM, no contractures. Normal muscle tone.  Skin:  no rashes, lesions, ulcers.  Neurologic: CN 2-12 grossly intact. Sensation intact, Strength 5/5 in all 4.  Psychiatric: Normal judgment and insight. Alert and oriented x 3. Normal mood.   Labs on Admission: I have personally reviewed following labs and imaging studies  Urine analysis:    Component Value Date/Time   COLORURINE YELLOW 07/14/2016 0305   APPEARANCEUR CLEAR 07/14/2016 0305   LABSPEC 1.013 07/14/2016 0305   PHURINE 5.5 07/14/2016 0305   GLUCOSEU NEGATIVE 07/14/2016 0305   HGBUR NEGATIVE 07/14/2016 0305   BILIRUBINUR NEGATIVE 07/14/2016 0305   KETONESUR 40 (A) 07/14/2016 0305   PROTEINUR NEGATIVE 07/14/2016 0305   UROBILINOGEN 0.2 08/12/2013 1038   NITRITE NEGATIVE 07/14/2016 0305   LEUKOCYTESUR TRACE (A) 07/14/2016 0305    Assessment/Plan   Principal Problem:   Sepsis (HCC) Active Problems:   Mastitis     Mastitis in patient is 2 weeks postpartum. Meets sepsis criteria with fever, tachypnea, tachycardia. Istat lactic acid normal x2.  -Admit to medical bed -Sepsis order set utilized -Follow up on blood and urine culture.      -Fluid resuscitation completed in ED, start  Maintenance IVF at 125 ml / hr -Continue with zosyn started in ED Will d/c Vanco -Still having right breast pain despite pain. Trial of NSAIDS -Lactation Specialist consult. I left message to at [facility] Special[facility] for someone to call us  -Will advise patient to have family member bring pump from home.    Tobacco use.  -RN to provided smoking cessation educations  DVT prophylaxis:   Lovenox   Code Status:     Full code Family Communication:    Disposition Plan:   Discharge in 48 hours              Consults called:  [facility] left message on office phone.  Admission status:   Admission - Medical bed   Willette ClusterPaula Boysie Bonebrake NP [facility] Pager [phone]  If 7PM-7AM, please contact night-coverage [email or web address] Password [email or web address]  07/14/2016, 7:14  AM

## 2016-07-14 NOTE — Discharge Summary (Addendum)
Vicki Tran, is a 30 y.o. female  DOB [birth year]  MRN [id number].  Admission date:  07/14/2016  Admitting Physician  No admitting provider for patient encounter.  Discharge Date:  07/14/2016   Primary MD  No PCP Per Patient  Recommendations for primary care physician for things to follow:  - Please hold your primary OB/GYN   Admission Diagnosis  breast pain; flu symptoms    Discharge Diagnosis  breast pain; flu symptoms     Principal Problem:   Sepsis (HCC) Active Problems:   Mastitis, acute   Tobacco use      Past Medical History:  Diagnosis Date  . Depression 2007    Past Surgical History:  Procedure Laterality Date  . NO PAST SURGERIES         History of present illness and  Hospital Course:     Kindly see H&P for history of present illness and admission details, please review complete Labs, Consult reports and Test reports for all details in brief  HPI  from the history and physical done on the day of admission 07/14/2016  HPI: Vicki Tran is a 30 y.o. female with no significant PMH, just depression for which she is not currently being treated. Patient is 2 weeks postpartum, first baby. She had been breastfeeding without problems but then yesterday developed pain and swelling in right breast associated with fever and myalgias. She tried to pump the affected breast but didn't get any mild. Patient believes she wasn't emptying her breast adequately and this led to infections. No urinary symptoms. No cough   ED Course:   Presenting tip 1 to 1.5, now down to 100. Presenting heart rate 143, down to 106. Patient is normotensive, normal oxygen saturation on room air. Lactic acid normal times 2, chem profile unremarkable. WBC 10.9, CBC otherwise unremarkable. Urinalysis shows 40 ketones, otherwise unremarkable EKG shows sinus tachycardia Code sepsis called  Hospital Course  Mastitis  in patient is 2 weeks postpartum. Meets sepsis criteria with fever, tachypnea, tachycardia. Istat lactic acid normal x2. Patient received multiple IV boluses, she was started on IV Zosyn, as seen by lactation medication, she was provided a pump, where she was able to produce milk from right breast with significant mastitis, significant improvement of her symptoms, able to use the pump, and be discharged home today, on Augmentin total of 7 days, and encourage to use pump and breast-feed, if return of fever or worsening of symptoms she was instructed to come back to ED. - Patient had significant trouble with improvement of her system with IV and he was fluid bolus, so will able to discharge her today   Discharge Condition:  Stable      Discharge Instructions  and  Discharge Medications    Discharge Instructions    Discharge instructions    Complete by:  As directed    Follow with Primary MD in 7 days   Get CBC, CMP,  checked  by Primary MD next visit.    Activity: As tolerated  Disposition Home    Diet: Regular diet    On your next visit with your primary care physician please Get Medicines reviewed and adjusted.   Please request your Prim.MD to go over all Hospital Tests and Procedure/Radiological results at the follow up, please get all Hospital records sent to your Prim MD by signing hospital release before you go home.   If you experience worsening of your admission symptoms, develop shortness of breath, life threatening emergency, suicidal or homicidal thoughts you must seek medical attention immediately by calling 911 or calling your MD immediately  if symptoms less severe.  You Must read complete instructions/literature along with all the possible adverse reactions/side effects for all the Medicines you take and that have been prescribed to you. Take any new Medicines after you have completely understood and accpet all the possible adverse reactions/side effects.   Do  not drive, operating heavy machinery, perform activities at heights, swimming or participation in water activities or provide baby sitting services if your were admitted for syncope or siezures until you have seen by Primary MD or a Neurologist and advised to do so again.  Do not drive when taking Pain medications.    Do not take more than prescribed Pain, Sleep and Anxiety Medications  Special Instructions: If you have smoked or chewed Tobacco  in the last 2 yrs please stop smoking, stop any regular Alcohol  and or any Recreational drug use.  Wear Seat belts while driving.   Please note  You were cared for by a hospitalist during your hospital stay. If you have any questions about your discharge medications or the care you received while you were in the hospital after you are discharged, you can call the unit and asked to speak with the hospitalist on call if the hospitalist that took care of you is not available. Once you are discharged, your primary care physician will handle any further medical issues. Please note that NO REFILLS for any discharge medications will be authorized once you are discharged, as it is imperative that you return to your primary care physician (or establish a relationship with a primary care physician if you do not have one) for your aftercare needs so that they can reassess your need for medications and monitor your lab values   Increase activity slowly    Complete by:  As directed        Medication List    TAKE these medications   acetaminophen 325 MG tablet Commonly known as:  TYLENOL Take 2 tablets (650 mg total) by mouth every 4 (four) hours as needed (for pain scale < 4).   amoxicillin-clavulanate 875-125 MG tablet Commonly known as:  AUGMENTIN Take 1 tablet by mouth 2 (two) times daily.   ibuprofen 600 MG tablet Commonly known as:  ADVIL,MOTRIN Take 1 tablet (600 mg total) by mouth every 6 (six) hours.   oxyCODONE-acetaminophen 5-325 MG  tablet Commonly known as:  PERCOCET/ROXICET Take 1-2 tablets by mouth every 6 (six) hours as needed for severe pain.   prenatal multivitamin Tabs tablet Take 1 tablet by mouth daily.         Diet and Activity recommendation: See Discharge Instructions above     Major procedures and Radiology Reports - PLEASE review detailed and final reports for all details, in brief -     No results found.  Micro Results     No results found for this or any previous visit (from the past 240 hour(s)).  Today   Subjective:   Vicki Tran today has no headache,no chest or abdominal pain, mastitis pain significantly improved, she is asking to go home today  Objective:   Blood pressure 118/88, pulse 104, temperature 100 F (37.8 C), temperature source Oral, resp. rate 17, SpO2 99 %, currently breastfeeding.   Intake/Output Summary (Last 24 hours) at 07/14/16 1519 Last data filed at 07/14/16 0705  Gross per 24 hour  Intake             4450 ml  Output                0 ml  Net             4450 ml    Exam Awake Alert, Oriented x 3, No new F.N deficits, Normal affect [place].AT,PERRAL Supple Neck,No JVD, No cervical lymphadenopathy appriciated.  Symmetrical Chest wall movement, Good air movement bilaterally, CTAB Mildly tachycardic,No Gallops,Rubs or new Murmurs, No Parasternal Heave +ve B.Sounds, Abd Soft, Non tender, No organomegaly appriciated, No rebound -guarding or rigidity. No Cyanosis, Clubbing or edema, No new Rash or bruise  Data Review   CBC w Diff: Lab Results  Component Value Date   WBC 10.9 (H) 07/13/2016   HGB 13.2 07/13/2016   HCT 40.1 07/13/2016   PLT 251 07/13/2016   LYMPHOPCT 8 07/13/2016   MONOPCT 3 07/13/2016   EOSPCT 0 07/13/2016   BASOPCT 0 07/13/2016    CMP: Lab Results  Component Value Date   NA 135 07/13/2016   K 3.9 07/13/2016   CL 102 07/13/2016   CO2 22 07/13/2016   BUN 9 07/13/2016   CREATININE 0.90 07/13/2016   PROT 6.5 07/13/2016    ALBUMIN 3.1 (L) 07/13/2016   BILITOT 0.6 07/13/2016   ALKPHOS 93 07/13/2016   AST 21 07/13/2016   ALT 25 07/13/2016  .   Total Time in preparing paper work, data evaluation and todays exam - 35 minutes  Maytal Mijangos M.D on 07/14/2016 at 3:19 PM  [facility]   Office  [phone]

## 2016-07-14 NOTE — ED Notes (Signed)
Took pt to restroom via wheelchair 

## 2016-07-15 LAB — URINE CULTURE

## 2016-07-18 LAB — CULTURE, BLOOD (ROUTINE X 2)
CULTURE: NO GROWTH
Culture: NO GROWTH

## 2017-09-08 IMAGING — US US MFM FETAL NUCHAL TRANSLUCENCY
1 series · 13 of 28 positions shown · non-contrast
Comparison: none

[Series 1: us mfm fetal nuchal translucency · 0.19mm/px · 60 acquisitions, 13 frames shown]
[im 3/60]
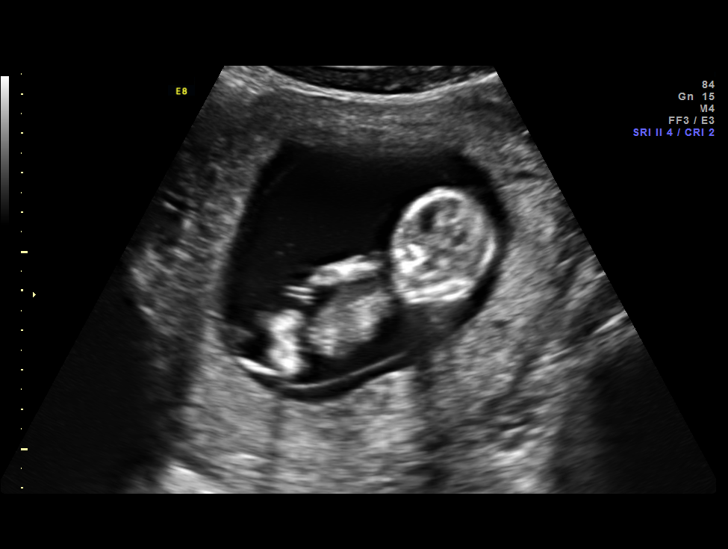
[im 7/60]
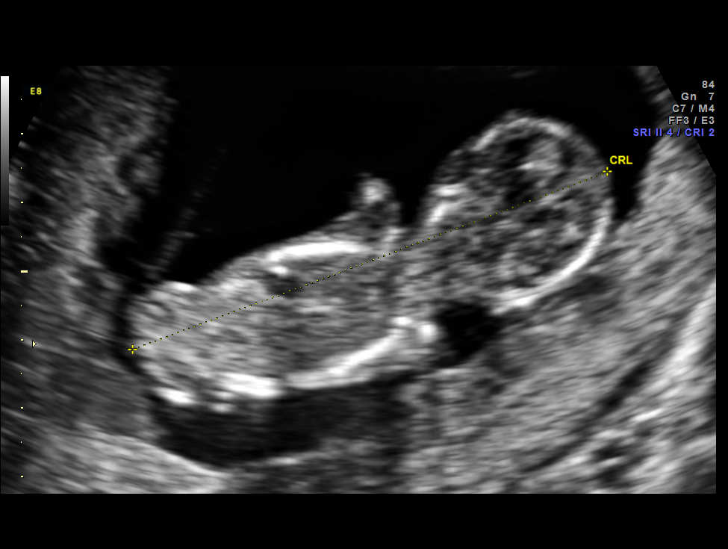
[im 11/60]
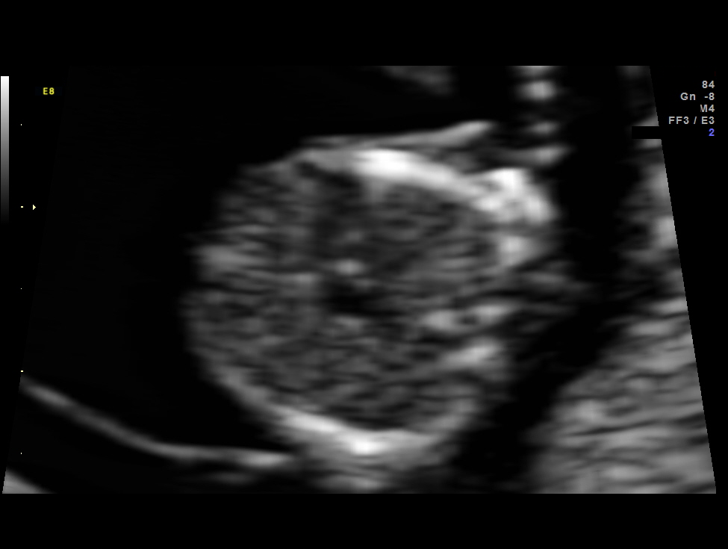
[im 16/60]
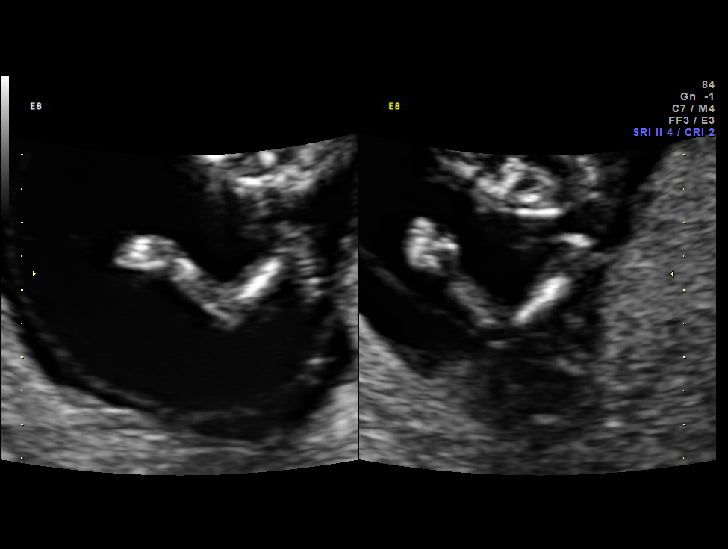
[im 20/60]
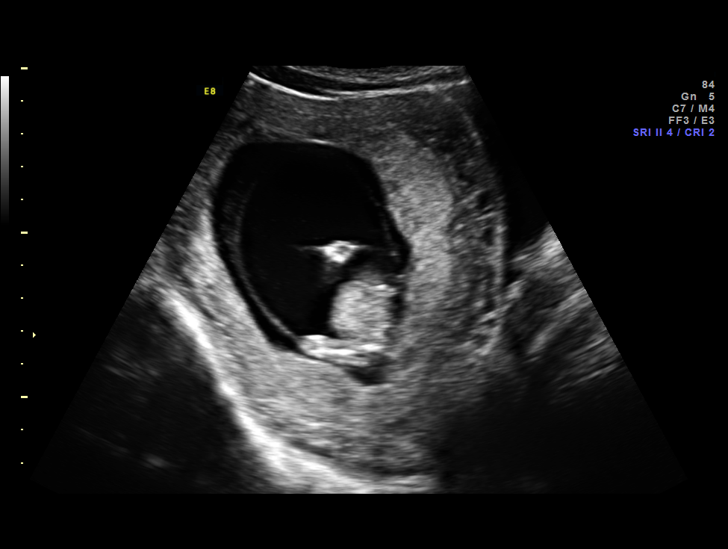
[im 25/60]
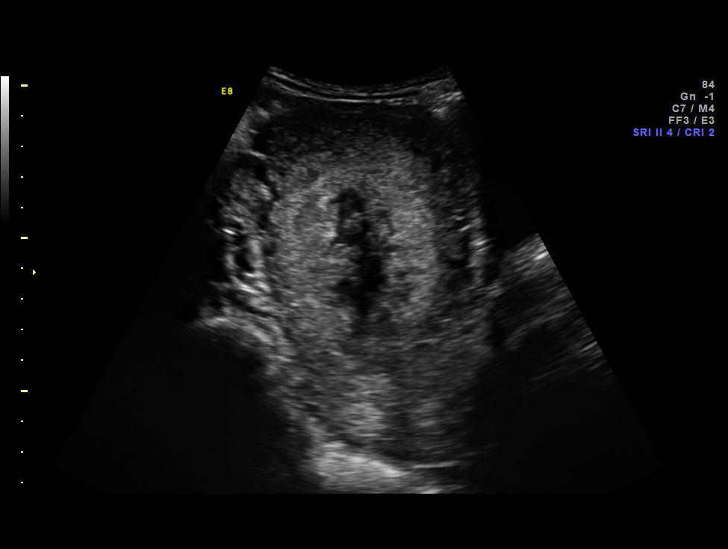
[im 31/60]
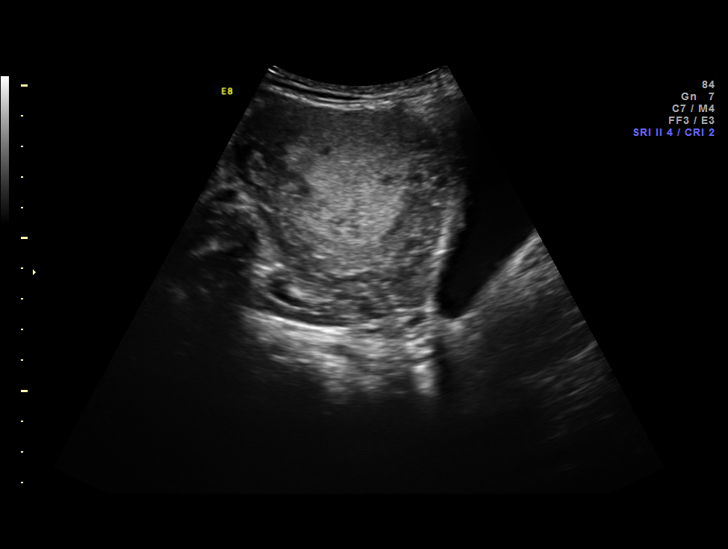
[im 35/60]
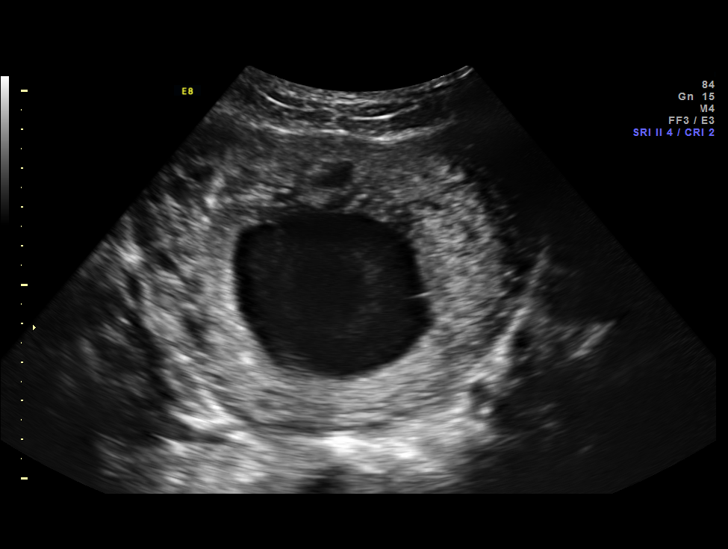
[im 40/60]
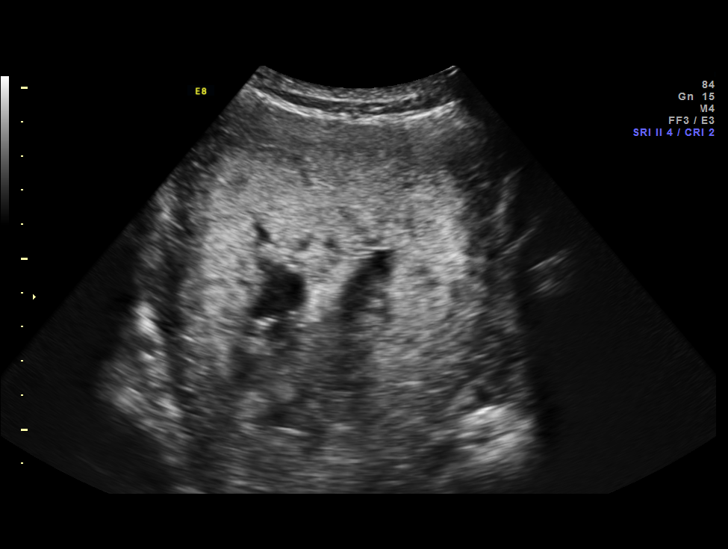
[im 44/60]
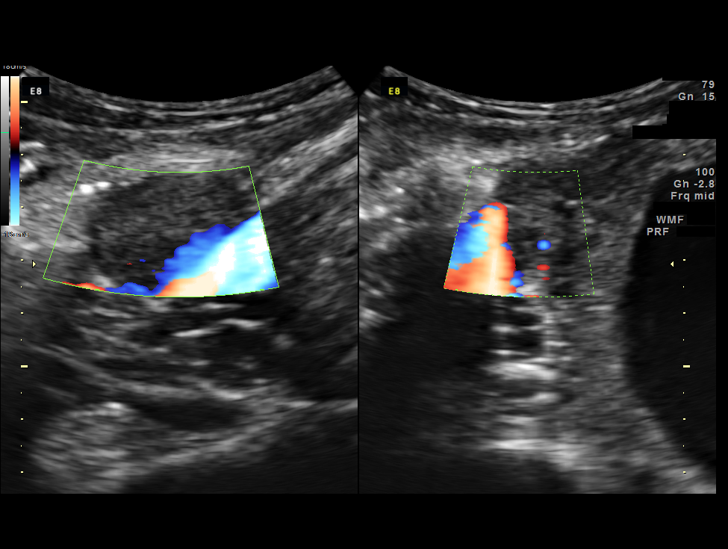
[im 49/60]
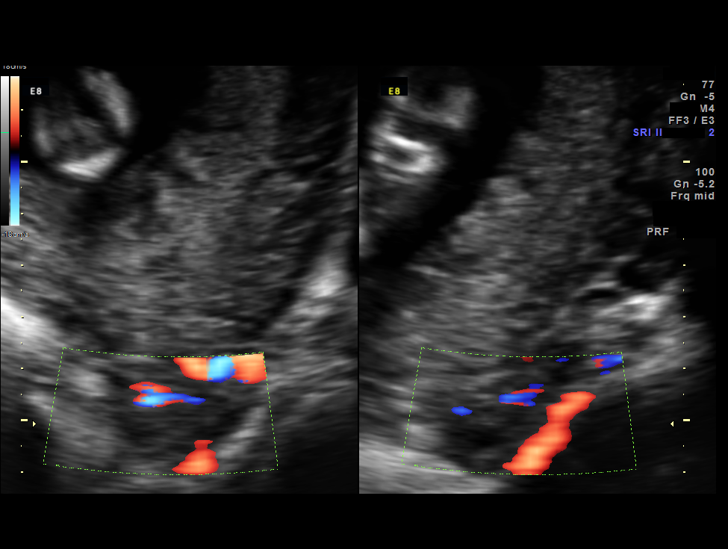
[im 53/60]
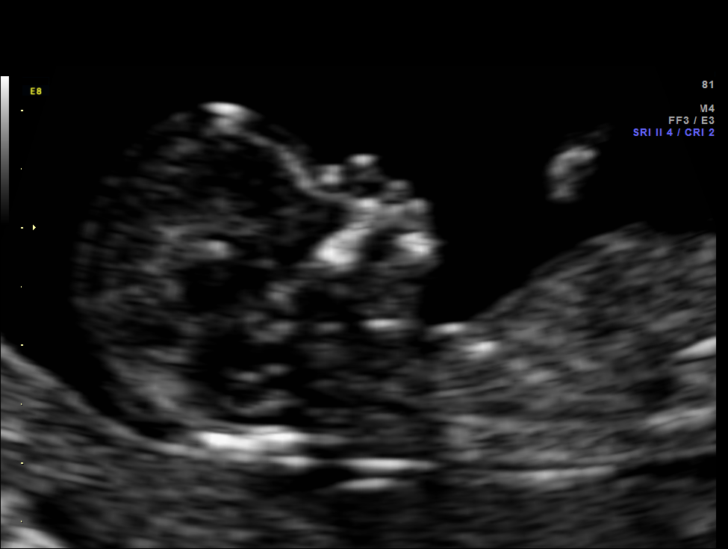
[im 57/60]
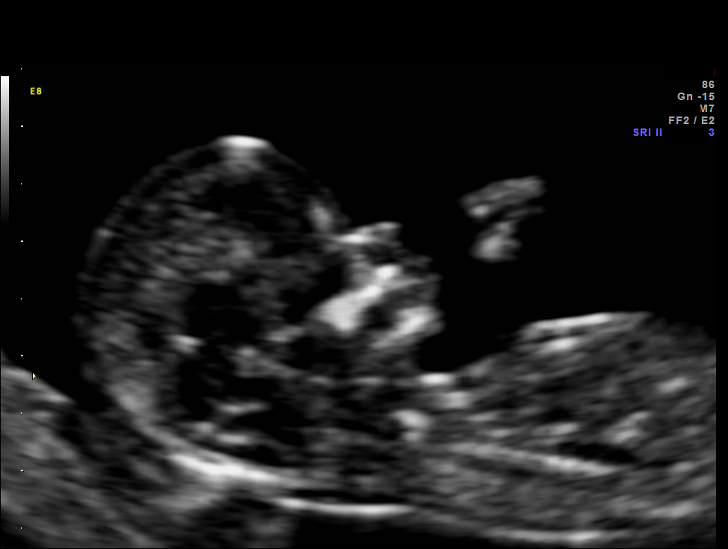

[13 of 28 positions shown; findings below may reference images not displayed]

[REDACTED]-
Faculty Physician

TRANSLUCENCY

1  [facility]             [id number]      [id number]     [id number]
Indications

12 weeks gestation of pregnancy
First trimester aneuploidy screen (NT)         Z36
OB History

Height:       5'6"    Weight:   106       BMI:
Gravidity:    1         Term:   0        Prem:   0        SAB:   0
TOP:          0       Ectopic:  0        Living: 0
Fetal Evaluation

Num Of Fetuses:     1
Preg. Location:     Intrauterine
Gest. Sac:          Intrauterine
Yolk Sac:           Not visualized
Fetal Pole:         Visualized
Fetal Heart         144
Rate(bpm):
Cardiac Activity:   Observed
Presentation:       Variable
Placenta:           Anterior, above cervical os

Amniotic Fluid
AFI FV:      Subjectively within normal limits
Larg Pckt:     3.8  cm
Gestational Age

LMP:           12w 6d       Date:   09/24/15                 EDD:   06/30/16
Best:          12w 6d    Det. By:   LMP  (09/24/15)          EDD:   06/30/16
1st Trimester Genetic Sonogram Screening

CRL:            69.7  mm    G. Age:   13w 0d                 EDD:   06/29/16
Nuc Trans:       1.3  mm
Nasal Bone:                 Present
Anatomy

Cranium:          Appears normal         Bladder:          Appears normal
Choroid Plexus:   Appears normal         Upper             Visualized
Extremities:
Stomach:          Appears normal, left   Lower             Visualized
sided                  Extremities:
Cervix Uterus Adnexa

Cervix
Normal appearance by transabdominal scan.

Uterus
No abnormality visualized.

Left Ovary
Size(cm)       2.8 x    3.48   x  2.05      Vol(ml):
Within normal limits. No adnexal mass visualized.

Right Ovary
Size(cm)     3.17  x    2.27   x  2.04      Vol(ml):
Within normal limits. No adnexal mass visualized.

Cul De Sac:   No free fluid seen.

Adnexa:       No abnormality visualized.
Impression

Single IUP at 12w 6d
Normal NT (1.3 mm).  Nasal bone visualized.
First trimester aneuploidy screen performed as noted above.
Please do not draw triple/quad screen, though patient should
be offered MSAFP for neural tube defect screening.
Recommendations

Recommend ultrasound for fetal anatomy at 18-20 weeks
gestation

## 2018-01-12 ENCOUNTER — Encounter (HOSPITAL_COMMUNITY): Payer: Self-pay

## 2018-01-12 ENCOUNTER — Emergency Department (HOSPITAL_COMMUNITY)
Admission: EM | Admit: 2018-01-12 | Discharge: 2018-01-13 | Disposition: A | Discharge status: Home / Self Care | Payer: Medicaid Other | Attending: Emergency Medicine | Admitting: Emergency Medicine

## 2018-01-12 DIAGNOSIS — R197 Diarrhea, unspecified: Secondary | ICD-10-CM | POA: Diagnosis not present

## 2018-01-12 DIAGNOSIS — R69 Illness, unspecified: Secondary | ICD-10-CM

## 2018-01-12 DIAGNOSIS — R112 Nausea with vomiting, unspecified: Secondary | ICD-10-CM | POA: Diagnosis not present

## 2018-01-12 DIAGNOSIS — R1084 Generalized abdominal pain: Secondary | ICD-10-CM | POA: Insufficient documentation

## 2018-01-12 DIAGNOSIS — F17210 Nicotine dependence, cigarettes, uncomplicated: Secondary | ICD-10-CM | POA: Diagnosis not present

## 2018-01-12 DIAGNOSIS — J111 Influenza due to unidentified influenza virus with other respiratory manifestations: Secondary | ICD-10-CM | POA: Diagnosis not present

## 2018-01-12 LAB — I-STAT BETA HCG BLOOD, ED (MC, WL, AP ONLY): I-stat hCG, quantitative: <5 m[IU]/mL (ref <5)

## 2018-01-12 LAB — CBC
HCT: 41.3 % (ref 36.0–46.0)
Hemoglobin: 13.6 g/dL (ref 12.0–15.0)
MCH: 28 pg (ref 26.0–34.0)
MCHC: 32.9 g/dL (ref 30.0–36.0)
MCV: 85 fL (ref 78.0–100.0)
Platelets: 178 10*3/uL (ref 150–400)
RBC: 4.86 MIL/uL (ref 3.87–5.11)
RDW: 14.2 % (ref 11.5–15.5)
WBC: 8.9 10*3/uL (ref 4.0–10.5)

## 2018-01-12 LAB — COMPREHENSIVE METABOLIC PANEL
ALT: 11 U/L — AB (ref 14–54)
ANION GAP: 12 (ref 5–15)
AST: 19 U/L (ref 15–41)
Albumin: 4 g/dL (ref 3.5–5.0)
Alkaline Phosphatase: 38 U/L (ref 38–126)
BUN: 10 mg/dL (ref 6–20)
CHLORIDE: 102 mmol/L (ref 101–111)
CO2: 22 mmol/L (ref 22–32)
CREATININE: 0.81 mg/dL (ref 0.44–1.00)
Calcium: 8.8 mg/dL — ABNORMAL LOW (ref 8.9–10.3)
Glucose, Bld: 95 mg/dL (ref 65–99)
POTASSIUM: 3.7 mmol/L (ref 3.5–5.1)
Sodium: 136 mmol/L (ref 135–145)
Total Bilirubin: 0.9 mg/dL (ref 0.3–1.2)
Total Protein: 6.9 g/dL (ref 6.5–8.1)

## 2018-01-12 LAB — URINALYSIS, ROUTINE W REFLEX MICROSCOPIC
BILIRUBIN URINE: NEGATIVE
Bacteria, UA: NONE SEEN
GLUCOSE, UA: NEGATIVE mg/dL
KETONES UR: NEGATIVE mg/dL
LEUKOCYTES UA: NEGATIVE
Nitrite: NEGATIVE
PH: 5 (ref 5.0–8.0)
Protein, ur: NEGATIVE mg/dL
Specific Gravity, Urine: 1.015 (ref 1.005–1.030)

## 2018-01-12 LAB — LIPASE, BLOOD: Lipase: 25 U/L (ref 11–51)

## 2018-01-12 MED ORDER — SODIUM CHLORIDE 0.9 % IV BOLUS (SEPSIS)
1000.0000 mL | Freq: Once | INTRAVENOUS | Status: AC
  Administered 2018-01-12: 1000 mL via INTRAVENOUS

## 2018-01-12 MED ORDER — KETOROLAC TROMETHAMINE 30 MG/ML IJ SOLN
30.0000 mg | Freq: Once | INTRAMUSCULAR | Status: AC
  Administered 2018-01-12: 30 mg via INTRAVENOUS
  Filled 2018-01-12: qty 1

## 2018-01-12 NOTE — ED Provider Notes (Signed)
[facility]-EMERGENCY DEPT Provider Note   CSN: [id number] Arrival date & time: 01/12/18  1913     History   Chief Complaint Chief Complaint  Patient presents with  . Abdominal Pain    HPI Vicki Tran is a 32 y.o. female possible history of depression who presents for evaluation of generalized abdominal pain, nausea/vomiting and diarrhea that began last night.  Patient reports that she was in her normal state of health prior to onset of symptoms.  She states that she has had several episodes of abdominal bloody, nonbilious emesis.  She reports no blood noted in the diarrhea.  She reports abdominal pain has improved but states that she is having generalized body aches.  Patient states she has had decreased appetite secondary to symptoms.  Patient states that she has not had a flu vaccine this year.  She has not had any sick contacts.  Patient denies any fevers, chest pain, difficulty breathing, dysuria, hematuria, vaginal discharge.   The history is provided by the patient.    Past Medical History:  Diagnosis Date  . Depression 2007    Patient Active Problem List   Diagnosis Date Noted  . Sepsis (HCC) 07/14/2016  . Mastitis, acute 07/14/2016  . Tobacco use 07/14/2016  . Pregnancy 07/03/2016    Past Surgical History:  Procedure Laterality Date  . NO PAST SURGERIES       OB History    Gravida  1   Para  1   Term  1   Preterm      AB      Living  1     SAB      TAB      Ectopic      Multiple  0   Live Births  1            Home Medications    Prior to Admission medications   Medication Sig Start Date End Date Taking? Authorizing Provider  acetaminophen (TYLENOL) 325 MG tablet Take 2 tablets (650 mg total) by mouth every 4 (four) hours as needed (for pain scale < 4). Patient not taking: Reported on 07/14/2016 07/05/16   Lorne SkeensSchenk, Nicholas Michael, MD  dicyclomine (BENTYL) 20 MG tablet Take 1 tablet (20 mg total) by mouth 2 (two)  times daily. 01/13/18   Maxwell CaulLayden, Evyn Kooyman A, PA-C  ibuprofen (ADVIL,MOTRIN) 600 MG tablet Take 1 tablet (600 mg total) by mouth every 6 (six) hours. Patient not taking: Reported on 01/12/2018 07/05/16   Lorne SkeensSchenk, Nicholas Michael, MD  ondansetron (ZOFRAN) 4 MG tablet Take 1 tablet (4 mg total) by mouth every 6 (six) hours. 01/13/18   Maxwell CaulLayden, Jaysen Wey A, PA-C  oseltamivir (TAMIFLU) 75 MG capsule Take 1 capsule (75 mg total) by mouth every 12 (twelve) hours. 01/13/18   Maxwell CaulLayden, Novella Abraha A, PA-C    Family History Family History  Problem Relation Age of Onset  . Diabetes Maternal Aunt   . Diabetes Paternal Aunt     Social History Social History   Tobacco Use  . Smoking status: Current Every Day Smoker    Packs/day: 0.50    Types: Cigarettes  . Smokeless tobacco: Never Used  Substance Use Topics  . Alcohol use: No  . Drug use: No     Allergies   Patient has no known allergies.   Review of Systems Review of Systems  Constitutional: Positive for appetite change. Negative for chills and fever.  HENT: Negative for congestion.   Eyes: Negative for visual  disturbance.  Respiratory: Negative for cough and shortness of breath.   Cardiovascular: Negative for chest pain.  Gastrointestinal: Positive for abdominal pain, diarrhea, nausea and vomiting.  Genitourinary: Negative for dysuria and hematuria.  Musculoskeletal: Positive for myalgias. Negative for back pain and neck pain.  Skin: Negative for rash.  Neurological: Negative for dizziness, weakness, numbness and headaches.  Psychiatric/Behavioral: Negative for confusion.     Physical Exam Updated Vital Signs BP (!) 101/55 (BP Location: Right Arm)   Pulse 98   Temp 97.9 F (36.6 C) (Oral)   Resp 14   Ht 5\' 6"  (1.676 m)   Wt 54.9 kg (121 lb)   SpO2 99%   BMI 19.53 kg/m   Physical Exam  Constitutional: She is oriented to person, place, and time. She appears well-developed and well-nourished.  HENT:  Head: Normocephalic and  atraumatic.  Mouth/Throat: Oropharynx is clear and moist and mucous membranes are normal.  Eyes: Pupils are equal, round, and reactive to light. Conjunctivae, EOM and lids are normal.  Neck: Full passive range of motion without pain.  Cardiovascular: Normal rate, regular rhythm, normal heart sounds and normal pulses. Exam reveals no gallop and no friction rub.  No murmur heard. Pulmonary/Chest: Effort normal and breath sounds normal.  No evidence of respiratory distress. Able to speak in full sentences without difficulty.  Abdominal: Soft. Normal appearance. There is generalized tenderness. There is no rigidity, no guarding and no CVA tenderness.  Abdomen is soft, nondistended.   Diffuse tenderness noted with no focal point.  No tenderness at McBurney's point..  No CVA tenderness bilaterally.  Musculoskeletal: Normal range of motion.  Neurological: She is alert and oriented to person, place, and time.  Skin: Skin is warm and dry. Capillary refill takes less than 2 seconds.  Psychiatric: She has a normal mood and affect. Her speech is normal.  Nursing note and vitals reviewed.    ED Treatments / Results  Labs (all labs ordered are listed, but only abnormal results are displayed) Labs Reviewed  COMPREHENSIVE METABOLIC PANEL - Abnormal; Notable for the following components:      Result Value   Calcium 8.8 (*)    ALT 11 (*)    All other components within normal limits  URINALYSIS, ROUTINE W REFLEX MICROSCOPIC - Abnormal; Notable for the following components:   Hgb urine dipstick MODERATE (*)    Squamous Epithelial / LPF 0-5 (*)    All other components within normal limits  LIPASE, BLOOD  CBC  I-STAT BETA HCG BLOOD, ED ([facility], [facility], [facility] ONLY)    EKG None  Radiology No results found.  Procedures Procedures (including critical care time)  Medications Ordered in ED Medications  sodium chloride 0.9 % bolus 1,000 mL (1,000 mLs Intravenous Not Given 01/13/18 0015)  sodium chloride 0.9 %  bolus 1,000 mL (0 mLs Intravenous Stopped 01/12/18 2240)  ketorolac (TORADOL) 30 MG/ML injection 30 mg (30 mg Intravenous Given 01/12/18 2134)  sodium chloride 0.9 % bolus 1,000 mL (0 mLs Intravenous Stopped 01/13/18 0027)     Initial Impression / Assessment and Plan / ED Course  I have reviewed the triage vital signs and the nursing notes.  Pertinent labs & imaging results that were available during my care of the patient were reviewed by me and considered in my medical decision making (see chart for details).     32 year old female who presents for evaluation of generalized abdominal pain, generalized body aches, nausea/vomiting, diarrhea that began last night.  No fevers, chest  pain, difficulty breathing.  No urinary complaints. Patient is afebrile, non-toxic appearing, sitting comfortably on examination table. Vital signs reviewed and stable.  On exam, patient has diffuse abdominal tenderness with no focal point.  Consider viral GI process, influenza.  History/physical exam is not concerning for pyelonephritis, kidney stone as patient is not having any unilateral flank pain, urinary complaints.  History/physical exam is not concerning for ovarian etiology, appendicitis, diverticulitis.  Plan to check basic labs, give fluids for resuscitation, analgesics, antiemetics here in the department.  UA negative for any acute infectious etiology.  I-STAT beta negative.  CMP is unremarkable.  CBC without any significant leukocytosis, anemia.  Lipase is unremarkable.  Discussed results with patient.  She reports improvement in pain after analgesics.  No episodes of vomiting here in the department.  We will plan to p.o. challenge patient department.  Patient able to tolerate p.o. without any difficulty.  Repeat abdominal exam shows improved tenderness significantly.  No rigidity, guarding.  No tenderness at McBurney's point.  Vital signs are stable.  Patient is slightly hypotensive.  She is gotten 2 L of fluid  here in the ED.  Records reviewed with the patient was consistently hangs in blood pressures of 110s.  Patient states that she is not currently having any pain.  She has had no active vomiting here in the ED.  Vital signs are stable.  Patient stable for discharge at this time.  I discussed possibility that this could be influenza.  I discussed treatment options with patient.  Patient would like to go ahead and get the Tamiflu.  I discussed risk first benefits.  We will plan to send patient home with antiemetics, analgesics.  Patient instructed on loosely monitoring her symptoms and returning for any worsening concerning symptoms.  Patient instructed follow-up with primary care doctor for further evaluation. Patient had ample opportunity for questions and discussion. All patient's questions were answered with full understanding. Strict return precautions discussed. Patient expresses understanding and agreement to plan.    Final Clinical Impressions(s) / ED Diagnoses   Final diagnoses:  Generalized abdominal pain  Nausea vomiting and diarrhea  Influenza-like illness    ED Discharge Orders        Ordered    oseltamivir (TAMIFLU) 75 MG capsule  Every 12 hours     01/13/18 0026    ondansetron (ZOFRAN) 4 MG tablet  Every 6 hours     01/13/18 0026    dicyclomine (BENTYL) 20 MG tablet  2 times daily     01/13/18 0026       Maxwell Caul, PA-C 01/13/18 0141    Melene Plan, DO 01/13/18 1558

## 2018-01-12 NOTE — ED Triage Notes (Signed)
Pt complains of abdominal pain for 12 hours, she states that she's had vomiting and diarrhea also Pt says that she has the urge to urinate and can't

## 2018-01-13 MED ORDER — OSELTAMIVIR PHOSPHATE 75 MG PO CAPS: 75.0000 mg | ORAL_CAPSULE | Freq: Two times a day (BID) | ORAL | 0 refills | Status: DC

## 2018-01-13 MED ORDER — SODIUM CHLORIDE 0.9 % IV BOLUS (SEPSIS): 1000.0000 mL | Freq: Once | INTRAVENOUS | Status: DC

## 2018-01-13 MED ORDER — DICYCLOMINE HCL 20 MG PO TABS: 20.0000 mg | ORAL_TABLET | Freq: Two times a day (BID) | ORAL | 0 refills | Status: DC

## 2018-01-13 MED ORDER — ONDANSETRON HCL 4 MG PO TABS: 4.0000 mg | ORAL_TABLET | Freq: Four times a day (QID) | ORAL | 0 refills | Status: DC

## 2018-01-13 NOTE — Discharge Instructions (Signed)
Take Tamiflu as directed.  It may cause some nausea and vomiting.  I provided some Zofran to help with the symptoms.  Make sure you are staying hydrated drinking plenty of fluids.  As we discussed, closely monitor your symptoms.  Return to the ED for any fever, worsening abdominal pain, difficulty eating or drinking, persistent vomiting, blood in urine, vaginal bleeding, vaginal discharge, chest pain, difficulty breathing or any other worsening or concerning symptoms.

## 2018-02-18 ENCOUNTER — Encounter (HOSPITAL_COMMUNITY): Payer: Self-pay | Admitting: Emergency Medicine

## 2018-02-18 ENCOUNTER — Ambulatory Visit (HOSPITAL_COMMUNITY)
Admission: EM | Admit: 2018-02-18 | Discharge: 2018-02-18 | Disposition: A | Discharge status: Home / Self Care | Payer: Medicaid Other | Attending: Internal Medicine | Admitting: Internal Medicine

## 2018-02-18 ENCOUNTER — Ambulatory Visit (INDEPENDENT_AMBULATORY_CARE_PROVIDER_SITE_OTHER): Payer: Medicaid Other

## 2018-02-18 DIAGNOSIS — R2241 Localized swelling, mass and lump, right lower limb: Secondary | ICD-10-CM | POA: Diagnosis not present

## 2018-02-18 DIAGNOSIS — Z79899 Other long term (current) drug therapy: Secondary | ICD-10-CM | POA: Insufficient documentation

## 2018-02-18 DIAGNOSIS — M7989 Other specified soft tissue disorders: Secondary | ICD-10-CM | POA: Insufficient documentation

## 2018-02-18 DIAGNOSIS — M25571 Pain in right ankle and joints of right foot: Secondary | ICD-10-CM

## 2018-02-18 DIAGNOSIS — M542 Cervicalgia: Secondary | ICD-10-CM | POA: Diagnosis not present

## 2018-02-18 DIAGNOSIS — F17210 Nicotine dependence, cigarettes, uncomplicated: Secondary | ICD-10-CM | POA: Insufficient documentation

## 2018-02-18 MED ORDER — KETOROLAC TROMETHAMINE 60 MG/2ML IM SOLN
INTRAMUSCULAR | Status: AC
  Filled 2018-02-18: qty 2

## 2018-02-18 MED ORDER — KETOROLAC TROMETHAMINE 60 MG/2ML IM SOLN
60.0000 mg | Freq: Once | INTRAMUSCULAR | Status: AC
  Administered 2018-02-18: 60 mg via INTRAMUSCULAR

## 2018-02-18 MED ORDER — NAPROXEN 500 MG PO TABS: 500.0000 mg | ORAL_TABLET | Freq: Two times a day (BID) | ORAL | 0 refills | Status: DC

## 2018-02-18 NOTE — Discharge Instructions (Addendum)
Ice, elevate, use of ACE wrap for compression and support. Naproxen twice a day, take with food. Do not take first dose until tonight. Activity as tolerated.  Please establish with a primary care provider as you may need further evaluation of your intermittent yet chronic joint pains.

## 2018-02-18 NOTE — ED Triage Notes (Signed)
Pt sts right ankle pain x 4 days and generalized joint pain x 1 year

## 2018-02-18 NOTE — ED Provider Notes (Signed)
[facility]    CSN: [id number] Arrival date & time: 02/18/18  1100     History   Chief Complaint Chief Complaint  Patient presents with  . Ankle Pain  . Leg Swelling    HPI Zelene Barga is a 32 y.o. female.   Karalee presents with complaints of acute right ankle pain which started three days ago, she noticed it after being at the beach. Pain with weight bearing. No specific known injury. States she has had intermittent "flares" of hands, wrists and ankle pain for years now, with tenderness. Ankle is swollen which she states is not typical of her flairs. She has some red "knots" scattered to arms and legs as well. They are non tender and do not itch. Has not taken any medications for symptoms. Also has neck pain, no known injury, for 6 weeks. Does not have a PCP and has not had this evaluated in the past.    ROS per HPI.      Past Medical History:  Diagnosis Date  . Depression 2007    Patient Active Problem List   Diagnosis Date Noted  . Sepsis (HCC) 07/14/2016  . Mastitis, acute 07/14/2016  . Tobacco use 07/14/2016  . Pregnancy 07/03/2016    Past Surgical History:  Procedure Laterality Date  . NO PAST SURGERIES      OB History    Gravida  1   Para  1   Term  1   Preterm      AB      Living  1     SAB      TAB      Ectopic      Multiple  0   Live Births  1            Home Medications    Prior to Admission medications   Medication Sig Start Date End Date Taking? Authorizing Provider  acetaminophen (TYLENOL) 325 MG tablet Take 2 tablets (650 mg total) by mouth every 4 (four) hours as needed (for pain scale < 4). Patient not taking: Reported on 07/14/2016 07/05/16   Lorne Skeens, MD  dicyclomine (BENTYL) 20 MG tablet Take 1 tablet (20 mg total) by mouth 2 (two) times daily. 01/13/18   Maxwell Caul, PA-C  ibuprofen (ADVIL,MOTRIN) 600 MG tablet Take 1 tablet (600 mg total) by mouth every 6 (six) hours. Patient not  taking: Reported on 01/12/2018 07/05/16   Lorne Skeens, MD  naproxen (NAPROSYN) 500 MG tablet Take 1 tablet (500 mg total) by mouth 2 (two) times daily. 02/18/18   Georgetta Haber, NP  ondansetron (ZOFRAN) 4 MG tablet Take 1 tablet (4 mg total) by mouth every 6 (six) hours. 01/13/18   Maxwell Caul, PA-C  oseltamivir (TAMIFLU) 75 MG capsule Take 1 capsule (75 mg total) by mouth every 12 (twelve) hours. 01/13/18   Maxwell Caul, PA-C    Family History Family History  Problem Relation Age of Onset  . Diabetes Maternal Aunt   . Diabetes Paternal Aunt     Social History Social History   Tobacco Use  . Smoking status: Current Every Day Smoker    Packs/day: 0.50    Types: Cigarettes  . Smokeless tobacco: Never Used  Substance Use Topics  . Alcohol use: No  . Drug use: No     Allergies   Patient has no known allergies.   Review of Systems Review of Systems   Physical Exam Triage Vital  Signs ED Triage Vitals [02/18/18 1221]  Enc Vitals Group     BP 127/70     Pulse Rate 85     Resp 18     Temp 98.7 F (37.1 C)     Temp Source Oral     SpO2 100 %     Weight      Height      Head Circumference      Peak Flow      Pain Score      Pain Loc      Pain Edu?      Excl. in GC?    No data found.  Updated Vital Signs BP 127/70   Pulse 85   Temp 98.7 F (37.1 C) (Oral)   Resp 18   SpO2 100%   Visual Acuity Right Eye Distance:   Left Eye Distance:   Bilateral Distance:    Right Eye Near:   Left Eye Near:    Bilateral Near:     Physical Exam  Constitutional: She is oriented to person, place, and time. She appears well-developed and well-nourished. No distress.  Cardiovascular: Normal rate, regular rhythm and normal heart sounds.  Pulmonary/Chest: Effort normal and breath sounds normal.  Musculoskeletal:       Right ankle: She exhibits swelling. She exhibits normal range of motion, no ecchymosis, no deformity, no laceration and normal pulse.  Tenderness. Medial malleolus tenderness found. No lateral malleolus tenderness found. Achilles tendon normal.       Cervical back: She exhibits tenderness. She exhibits normal range of motion, no bony tenderness, no swelling and no edema.       Back:       Feet:  Neurological: She is alert and oriented to person, place, and time.  Skin: Skin is warm and dry.     UC Treatments / Results  Labs (all labs ordered are listed, but only abnormal results are displayed) Labs Reviewed  RPR    EKG None  Radiology Dg Ankle Complete Right  Result Date: 02/18/2018 CLINICAL DATA:  Right ankle pain. EXAM: RIGHT ANKLE - COMPLETE 3+ VIEW COMPARISON:  None FINDINGS: There is no evidence of fracture, dislocation, or joint effusion. There is no evidence of arthropathy or other focal bone abnormality. Medial soft tissue swelling identified. IMPRESSION: 1. No acute bone abnormality. 2. Medial soft tissue swelling. Electronically Signed   By: Signa Kell M.D.   On: 02/18/2018 13:27    Procedures Procedures (including critical care time)  Medications Ordered in UC Medications  ketorolac (TORADOL) injection 60 mg (has no administration in time range)    Initial Impression / Assessment and Plan / UC Course  I have reviewed the triage vital signs and the nursing notes.  Pertinent labs & imaging results that were available during my care of the patient were reviewed by me and considered in my medical decision making (see chart for details).     No known injury, medial ankle with tenderness and swelling noted. Xray reassuring. ACE wrap, ice, elevation, NSAIDS for pain control. RPR collected today, encouraged establish with PCP as may need further evaluation of recurring joint pains. Patient verbalized understanding and agreeable to plan.      Final Clinical Impressions(s) / UC Diagnoses   Final diagnoses:  Acute right ankle pain  Neck pain   Discharge Instructions   None    ED  Prescriptions    None     Controlled Substance Prescriptions [place] Controlled Substance Registry consulted? Not  Applicable   Georgetta Haber, NP 02/18/18 1336

## 2018-02-19 LAB — RPR: RPR Ser Ql: NONREACTIVE

## 2018-02-19 NOTE — Progress Notes (Signed)
Attempted to reach patient regarding normal results. No answer and voicemail not set up.

## 2018-04-08 ENCOUNTER — Encounter (HOSPITAL_COMMUNITY): Payer: Self-pay | Admitting: Family Medicine

## 2018-04-08 ENCOUNTER — Ambulatory Visit (HOSPITAL_COMMUNITY)
Admission: EM | Admit: 2018-04-08 | Discharge: 2018-04-08 | Disposition: A | Discharge status: Home / Self Care | Payer: Medicaid Other | Attending: Internal Medicine | Admitting: Internal Medicine

## 2018-04-08 DIAGNOSIS — G44311 Acute post-traumatic headache, intractable: Secondary | ICD-10-CM | POA: Diagnosis not present

## 2018-04-08 DIAGNOSIS — M549 Dorsalgia, unspecified: Secondary | ICD-10-CM | POA: Diagnosis not present

## 2018-04-08 MED ORDER — MELOXICAM 7.5 MG PO TABS: 7.5000 mg | ORAL_TABLET | Freq: Every day | ORAL | 0 refills | Status: DC
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
## 2018-04-08 NOTE — Discharge Instructions (Signed)
No alarming signs on your exam. Your symptoms can worsen the first 24-48 hours after the injury. Start Mobic. Do not take ibuprofen (motrin/advil)/ naproxen (aleve) while on mobic. You can take tylenol for additional pain relief needed. Ice/heat compresses as needed. This can take up to 3-4 weeks to completely resolve, but you should be feeling better each week. Follow up here or with PCP if symptoms worsen, changes for reevaluation. If experiencing worsening of symptoms, headache/blurry vision, nausea/vomiting, confusion/altered mental status, painful eye movement, dizziness, weakness, passing out, imbalance, go to the emergency department for further evaluation.

## 2018-07-29 ENCOUNTER — Ambulatory Visit (HOSPITAL_COMMUNITY)
Admission: EM | Admit: 2018-07-29 | Discharge: 2018-07-29 | Disposition: A | Discharge status: Home / Self Care | Payer: Medicaid Other | Attending: Emergency Medicine | Admitting: Emergency Medicine

## 2018-07-29 ENCOUNTER — Other Ambulatory Visit: Payer: Self-pay

## 2018-07-29 ENCOUNTER — Encounter (HOSPITAL_COMMUNITY): Payer: Self-pay

## 2018-07-29 DIAGNOSIS — S161XXA Strain of muscle, fascia and tendon at neck level, initial encounter: Secondary | ICD-10-CM

## 2018-07-29 DIAGNOSIS — S29012A Strain of muscle and tendon of back wall of thorax, initial encounter: Secondary | ICD-10-CM

## 2018-07-29 MED ORDER — CYCLOBENZAPRINE HCL 5 MG PO TABS: 5.0000 mg | ORAL_TABLET | Freq: Two times a day (BID) | ORAL | 0 refills | Status: DC | PRN

## 2018-07-29 MED ORDER — MELOXICAM 7.5 MG PO TABS: 7.5000 mg | ORAL_TABLET | Freq: Every day | ORAL | 0 refills | Status: DC
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
## 2018-07-29 NOTE — Discharge Instructions (Signed)
Heat, massage, stretching, light activity as tolerated to prevent muscle spasm.  Muscle relaxer as needed for spasms and tightness. May cause drowsiness. May be more beneficial to take at night. Please do not take if driving or drinking alcohol.   Meloxicam daily, take with food.  If symptoms worsen or do not improve in the next 3-4 weeks to follow up with your primary care provider.

## 2020-05-17 ENCOUNTER — Ambulatory Visit: Payer: Medicaid Other | Attending: Physician Assistant | Admitting: Physical Therapy

## 2021-02-25 ENCOUNTER — Emergency Department (HOSPITAL_COMMUNITY)
Admission: EM | Admit: 2021-02-25 | Discharge: 2021-02-25 | Disposition: A | Discharge status: Home / Self Care | Payer: Medicaid Other | Attending: Emergency Medicine | Admitting: Emergency Medicine

## 2021-02-25 ENCOUNTER — Emergency Department (HOSPITAL_COMMUNITY): Payer: Medicaid Other

## 2021-02-25 ENCOUNTER — Encounter (HOSPITAL_COMMUNITY): Payer: Self-pay

## 2021-02-25 DIAGNOSIS — R112 Nausea with vomiting, unspecified: Secondary | ICD-10-CM | POA: Diagnosis present

## 2021-02-25 DIAGNOSIS — R197 Diarrhea, unspecified: Secondary | ICD-10-CM | POA: Diagnosis not present

## 2021-02-25 DIAGNOSIS — R1010 Upper abdominal pain, unspecified: Secondary | ICD-10-CM | POA: Diagnosis not present

## 2021-02-25 DIAGNOSIS — F17210 Nicotine dependence, cigarettes, uncomplicated: Secondary | ICD-10-CM | POA: Insufficient documentation

## 2021-02-25 LAB — URINALYSIS, ROUTINE W REFLEX MICROSCOPIC
Bacteria, UA: NONE SEEN
Bilirubin Urine: NEGATIVE
Glucose, UA: NEGATIVE mg/dL
Ketones, ur: 20 mg/dL — AB
Leukocytes,Ua: NEGATIVE
Nitrite: NEGATIVE
Protein, ur: NEGATIVE mg/dL
Specific Gravity, Urine: 1.009 (ref 1.005–1.030)
pH: 7 (ref 5.0–8.0)

## 2021-02-25 LAB — CBC WITH DIFFERENTIAL/PLATELET
Abs Immature Granulocytes: 0.06 10*3/uL (ref 0.00–0.07)
Basophils Absolute: 0 10*3/uL (ref 0.0–0.1)
Basophils Relative: 0 %
Eosinophils Absolute: 0 10*3/uL (ref 0.0–0.5)
Eosinophils Relative: 0 %
HCT: 39.6 % (ref 36.0–46.0)
Hemoglobin: 12.7 g/dL (ref 12.0–15.0)
Immature Granulocytes: 1 %
Lymphocytes Relative: 5 %
Lymphs Abs: 0.6 10*3/uL — ABNORMAL LOW (ref 0.7–4.0)
MCH: 28.2 pg (ref 26.0–34.0)
MCHC: 32.1 g/dL (ref 30.0–36.0)
MCV: 87.8 fL (ref 80.0–100.0)
Monocytes Absolute: 0.6 10*3/uL (ref 0.1–1.0)
Monocytes Relative: 5 %
Neutro Abs: 10.9 10*3/uL — ABNORMAL HIGH (ref 1.7–7.7)
Neutrophils Relative %: 89 %
Platelets: 178 10*3/uL (ref 150–400)
RBC: 4.51 MIL/uL (ref 3.87–5.11)
RDW: 14.7 % (ref 11.5–15.5)
WBC: 12.3 10*3/uL — ABNORMAL HIGH (ref 4.0–10.5)
nRBC: 0 % (ref 0.0–0.2)

## 2021-02-25 LAB — I-STAT BETA HCG BLOOD, ED (MC, WL, AP ONLY): I-stat hCG, quantitative: <5 m[IU]/mL (ref <5)

## 2021-02-25 LAB — COMPREHENSIVE METABOLIC PANEL
ALT: 12 U/L (ref 0–44)
AST: 17 U/L (ref 15–41)
Albumin: 4 g/dL (ref 3.5–5.0)
Alkaline Phosphatase: 37 U/L — ABNORMAL LOW (ref 38–126)
Anion gap: 5 (ref 5–15)
BUN: 9 mg/dL (ref 6–20)
CO2: 23 mmol/L (ref 22–32)
Calcium: 8.4 mg/dL — ABNORMAL LOW (ref 8.9–10.3)
Chloride: 115 mmol/L — ABNORMAL HIGH (ref 98–111)
Creatinine, Ser: 0.66 mg/dL (ref 0.44–1.00)
GFR, Estimated: >60 mL/min (ref >60)
Glucose, Bld: 97 mg/dL (ref 70–99)
Potassium: 4 mmol/L (ref 3.5–5.1)
Sodium: 143 mmol/L (ref 135–145)
Total Bilirubin: 0.5 mg/dL (ref 0.3–1.2)
Total Protein: 7 g/dL (ref 6.5–8.1)

## 2021-02-25 LAB — LIPASE, BLOOD: Lipase: 35 U/L (ref 11–51)

## 2021-02-25 MED ORDER — SODIUM CHLORIDE 0.9 % IV SOLN: INTRAVENOUS | Status: DC

## 2021-02-25 MED ORDER — SODIUM CHLORIDE 0.9 % IV BOLUS
1000.0000 mL | Freq: Once | INTRAVENOUS | Status: AC
  Administered 2021-02-25: 1000 mL via INTRAVENOUS

## 2021-02-25 MED ORDER — HALOPERIDOL LACTATE 5 MG/ML IJ SOLN
5.0000 mg | Freq: Once | INTRAMUSCULAR | Status: AC
  Administered 2021-02-25: 5 mg via INTRAVENOUS
  Filled 2021-02-25: qty 1

## 2021-02-25 MED ORDER — ONDANSETRON HCL 4 MG/2ML IJ SOLN
4.0000 mg | Freq: Once | INTRAMUSCULAR | Status: AC
  Administered 2021-02-25: 4 mg via INTRAVENOUS
  Filled 2021-02-25: qty 2

## 2021-02-25 MED ORDER — ONDANSETRON 4 MG PO TBDP: 4.0000 mg | ORAL_TABLET | Freq: Three times a day (TID) | ORAL | 0 refills | Status: DC | PRN

## 2021-02-25 MED ORDER — IOHEXOL 300 MG/ML  SOLN
100.0000 mL | Freq: Once | INTRAMUSCULAR | Status: AC | PRN
  Administered 2021-02-25: 100 mL via INTRAVENOUS

## 2021-02-25 NOTE — ED Notes (Signed)
Pt able to eat saltine crackers and drink water. Pt stated she felt "fine".

## 2021-02-25 NOTE — ED Notes (Signed)
Pt transported to CT ?

## 2021-02-25 NOTE — ED Notes (Signed)
External female catheter placed on patient Bed alarm on

## 2021-02-25 NOTE — ED Provider Notes (Signed)
Care of the patient assumed at the change of shift.  Physical Exam  BP (!) 108/92   Pulse 73   Temp 98.4 F (36.9 C) (Oral)   Resp 13   SpO2 100%   Physical Exam Resting comfortably No distress Abdomen is benign ED Course/Procedures   Clinical Course as of 02/25/21 1702  Fri Feb 25, 2021  1339 Laboratory tests show slightly elevated white blood cell count.  Metabolic panel is unremarkable.  Urinalysis still pending [JK]  1347 Patient still vomiting and has pain.  She does have an elevated white blood cell count.  We will proceed with CT scan [JK]    Clinical Course User Index [JK] Linwood Dibbles, MD    Procedures  MDM  Labs and CT are unremarkable. She admits to marijuana use but doesn't think that is the cause of her discomfort. Will give a dose of haldol and PO trial. Anticipate discharge if able to tolerate.      Pollyann Savoy, MD 02/25/21 [phone]

## 2021-02-25 NOTE — ED Provider Notes (Signed)
[facility]-EMERGENCY DEPT Provider Note   CSN: [id number] Arrival date & time: 02/25/21  1048     History Chief Complaint  Patient presents with  . Nausea  . Emesis    Vicki Tran is a 35 y.o. female.  HPI   Patient presents to the ED for evaluation of nausea vomiting and abdominal cramping.  Patient states she started having symptoms early this morning.  She began having episodes of vomiting as well as diarrhea.  She is having cramping pain in her upper abdomen.  It does not radiate.  Patient has felt chilled but she has not measured her temperature to know if she has had any fevers.  She is not having any difficulty urinating.  Patient denies any prior surgery.  Patient denies any heavy alcohol use  Past Medical History:  Diagnosis Date  . Depression 2007    Patient Active Problem List   Diagnosis Date Noted  . Sepsis (HCC) 07/14/2016  . Mastitis, acute 07/14/2016  . Tobacco use 07/14/2016  . Pregnancy 07/03/2016    Past Surgical History:  Procedure Laterality Date  . NO PAST SURGERIES       OB History    Gravida  1   Para  1   Term  1   Preterm      AB      Living  1     SAB      IAB      Ectopic      Multiple  0   Live Births  1           Family History  Problem Relation Age of Onset  . Diabetes Maternal Aunt   . Diabetes Paternal Aunt     Social History   Tobacco Use  . Smoking status: Current Every Day Smoker    Packs/day: 0.50    Types: Cigarettes  . Smokeless tobacco: Never Used  Substance Use Topics  . Alcohol use: No  . Drug use: No    Home Medications Prior to Admission medications   Medication Sig Start Date End Date Taking? Authorizing Provider  cyclobenzaprine (FLEXERIL) 5 MG tablet Take 1 tablet (5 mg total) by mouth 2 (two) times daily as needed for muscle spasms. 07/29/18   Georgetta Haber, NP  meloxicam (MOBIC) 7.5 MG tablet Take 1 tablet (7.5 mg total) by mouth daily. 07/29/18   Georgetta Haber, NP    Allergies    Patient has no known allergies.  Review of Systems   Review of Systems  All other systems reviewed and are negative.   Physical Exam Updated Vital Signs BP (!) 100/59   Pulse 79   Temp 98.4 F (36.9 C) (Oral)   SpO2 100%   Physical Exam Vitals and nursing note reviewed.  Constitutional:      General: She is not in acute distress.    Appearance: She is well-developed.  HENT:     Head: Normocephalic and atraumatic.     Right Ear: External ear normal.     Left Ear: External ear normal.  Eyes:     General: No scleral icterus.       Right eye: No discharge.        Left eye: No discharge.     Conjunctiva/sclera: Conjunctivae normal.  Neck:     Trachea: No tracheal deviation.  Cardiovascular:     Rate and Rhythm: Normal rate and regular rhythm.  Pulmonary:     Effort:  Pulmonary effort is normal. No respiratory distress.     Breath sounds: Normal breath sounds. No stridor. No wheezing or rales.  Abdominal:     General: Bowel sounds are normal. There is no distension.     Palpations: Abdomen is soft.     Tenderness: There is abdominal tenderness. There is no guarding or rebound.     Comments: Tenderness in the epigastric region  Musculoskeletal:        General: No tenderness.     Cervical back: Neck supple.  Skin:    General: Skin is warm and dry.     Findings: No rash.  Neurological:     Mental Status: She is alert.     Cranial Nerves: No cranial nerve deficit (no facial droop, extraocular movements intact, no slurred speech).     Sensory: No sensory deficit.     Motor: No abnormal muscle tone or seizure activity.     Coordination: Coordination normal.     ED Results / Procedures / Treatments   Labs (all labs ordered are listed, but only abnormal results are displayed) Labs Reviewed - No data to display  EKG None  Radiology No results found.  Procedures Procedures   Medications Ordered in ED Medications  sodium chloride  0.9 % bolus 1,000 mL (has no administration in time range)    And  sodium chloride 0.9 % bolus 1,000 mL (has no administration in time range)    And  0.9 %  sodium chloride infusion (has no administration in time range)  ondansetron (ZOFRAN) injection 4 mg (has no administration in time range)    ED Course  I have reviewed the triage vital signs and the nursing notes.  Pertinent labs & imaging results that were available during my care of the patient were reviewed by me and considered in my medical decision making (see chart for details).  Clinical Course as of 02/26/21 0714  Fri Feb 25, 2021  1339 Laboratory tests show slightly elevated white blood cell count.  Metabolic panel is unremarkable.  Urinalysis still pending [JK]  1347 Patient still vomiting and has pain.  She does have an elevated white blood cell count.  We will proceed with CT scan [JK]    Clinical Course User Index [JK] Linwood Dibbles, MD   MDM Rules/Calculators/A&P                          Pt presented with abd pain, vomiting, diarrhea.   Labs notable for elevated wbc.  Doubt hepatitis, pancreatitis.  ?biliary etiology, appendicitis, gastroenteritis.  With elevated wbc and persistent sx ct scan ordered for further evaluation.  Dr Bernette Mayers to follow on CT.  Anticipate dc if ct scan reassuring. Final Clinical Impression(s) / ED Diagnoses Final diagnoses:  Non-intractable vomiting with nausea, unspecified vomiting type    Rx / DC Orders ED Discharge Orders    None       Linwood Dibbles, MD 02/26/21 [phone]

## 2021-02-25 NOTE — ED Triage Notes (Signed)
Pt arrived via EMS, from home, n/v abd cramping.    20 G 4mg  500cc NS

## 2022-02-01 ENCOUNTER — Ambulatory Visit (HOSPITAL_COMMUNITY)
Admission: EM | Admit: 2022-02-01 | Discharge: 2022-02-01 | Disposition: A | Discharge status: Home / Self Care | Payer: Medicaid Other | Attending: Nurse Practitioner | Admitting: Nurse Practitioner

## 2022-02-01 ENCOUNTER — Encounter (HOSPITAL_COMMUNITY): Payer: Self-pay

## 2022-02-01 ENCOUNTER — Ambulatory Visit (INDEPENDENT_AMBULATORY_CARE_PROVIDER_SITE_OTHER): Payer: Medicaid Other

## 2022-02-01 DIAGNOSIS — M79631 Pain in right forearm: Secondary | ICD-10-CM

## 2022-02-01 DIAGNOSIS — S59911A Unspecified injury of right forearm, initial encounter: Secondary | ICD-10-CM

## 2022-02-01 DIAGNOSIS — M25531 Pain in right wrist: Secondary | ICD-10-CM

## 2022-02-01 MED ORDER — ACETAMINOPHEN 325 MG PO TABS
650.0000 mg | ORAL_TABLET | Freq: Once | ORAL | Status: AC
  Administered 2022-02-01: 650 mg via ORAL

## 2022-02-01 MED ORDER — ACETAMINOPHEN 325 MG PO TABS
ORAL_TABLET | ORAL | Status: AC
  Filled 2022-02-01: qty 2

## 2022-02-01 NOTE — Discharge Instructions (Addendum)
-   Forearm x-ray today shows a possible fracture of your right wrist; however this may be an old fracture ?- We have given you Tylenol today and applied a splint to your wrist ?-You can continue alternating ibuprofen and Tylenol for the pain ?- Please follow up with an Orthopedic doctor at the number provided below - you need to call to make an appointment ?

## 2022-02-01 NOTE — ED Triage Notes (Signed)
Pt reports a 250# weight fall on her on last week. C/o right arm pain since the incident.  ?

## 2022-02-01 NOTE — ED Provider Notes (Signed)
?[facility] ? ? ? ?CSN: [id number] ?Arrival date & time: 02/01/22  [id number] ? ? ?  ? ?History   ?Chief Complaint ?No chief complaint on file. ? ? ?HPI ?Vicki Tran is a 36 y.o. female.  ? ?Patient reports pain to right forearm, wrist, and fingers after a heavy weight fell on her forearm about a week ago.  She describes the pain as cramping/charley horse.  She reports the weight was close to falling on her son and she was trying to prevent that from happening.  She denies any numbness or tingling to her fingers, deformity, erythema, decreased range of motion, or swelling to the right forearm, wrist, or fingers.  There is no elbow or shoulder pain.  She denies nausea/vomiting, and fevers.  She has taken ibuprofen at nighttime which helps relieve the pain.   ? ?Medical history is noncontributory. ? ? ?Past Medical History:  ?Diagnosis Date  ? Depression 2007  ? ? ?Patient Active Problem List  ? Diagnosis Date Noted  ? Sepsis (HCC) 07/14/2016  ? Mastitis, acute 07/14/2016  ? Tobacco use 07/14/2016  ? Pregnancy 07/03/2016  ? ? ?Past Surgical History:  ?Procedure Laterality Date  ? NO PAST SURGERIES    ? ? ?OB History   ? ? Gravida  ?1  ? Para  ?1  ? Term  ?1  ? Preterm  ?   ? AB  ?   ? Living  ?1  ?  ? ? SAB  ?   ? IAB  ?   ? Ectopic  ?   ? Multiple  ?0  ? Live Births  ?1  ?   ?  ?  ? ? ? ?Home Medications   ? ?Prior to Admission medications   ?Not on File  ? ? ?Family History ?Family History  ?Problem Relation Age of Onset  ? Diabetes Maternal Aunt   ? Diabetes Paternal Aunt   ? ? ?Social History ?Social History  ? ?Tobacco Use  ? Smoking status: Every Day  ?  Packs/day: 0.50  ?  Types: Cigarettes  ? Smokeless tobacco: Never  ?Substance Use Topics  ? Alcohol use: No  ? Drug use: No  ? ? ? ?Allergies   ?Patient has no known allergies. ? ? ?Review of Systems ?Review of Systems ?Per HPI ? ?Physical Exam ?Triage Vital Signs ?ED Triage Vitals [02/01/22 0829]  ?Enc Vitals Group  ?   BP 104/72  ?   Pulse Rate (!) 122  ?    Resp 16  ?   Temp 98.2 ?F (36.8 ?C)  ?   Temp Source Oral  ?   SpO2 98 %  ?   Weight   ?   Height   ?   Head Circumference   ?   Peak Flow   ?   Pain Score 8  ?   Pain Loc   ?   Pain Edu?   ?   Excl. in GC?   ? ?No data found. ? ?Updated Vital Signs ?BP 104/72 (BP Location: Left Arm)   Pulse (!) 122   Temp 98.2 ?F (36.8 ?C) (Oral)   Resp 16   SpO2 98%  ? ?Visual Acuity ?Right Eye Distance:   ?Left Eye Distance:   ?Bilateral Distance:   ? ?Right Eye Near:   ?Left Eye Near:    ?Bilateral Near:    ? ?Physical Exam ?Vitals and nursing note reviewed.  ?Constitutional:   ?   General:  She is not in acute distress. ?   Appearance: Normal appearance. She is not toxic-appearing.  ?HENT:  ?   Head: Normocephalic and atraumatic.  ?Musculoskeletal:  ?   Right upper arm: Normal.  ?   Left upper arm: Normal.  ?   Right elbow: Normal.  ?   Left elbow: Normal.  ?   Right forearm: Tenderness present. No swelling, edema, deformity or bony tenderness.  ?   Left forearm: Normal.  ?   Right wrist: Normal. No tenderness. Normal range of motion. Normal pulse.  ?   Right hand: Normal. Normal range of motion. Normal strength. Normal sensation. There is no disruption of two-point discrimination. Normal capillary refill. Normal pulse.  ?   Comments: Tender to palpation diffusely around forearm.  No obvious swelling, deformity, erythema, or warmth.  On exam, she is able to lift her right thumb off of a flat surface.   ?Skin: ?   General: Skin is warm and dry.  ?   Capillary Refill: Capillary refill takes less than 2 seconds.  ?   Coloration: Skin is not jaundiced or pale.  ?   Findings: No erythema.  ?Neurological:  ?   Mental Status: She is alert and oriented to person, place, and time.  ?   Motor: No weakness.  ?   Gait: Gait normal.  ?Psychiatric:     ?   Mood and Affect: Mood normal.     ?   Behavior: Behavior normal.  ? ? ? ?UC Treatments / Results  ?Labs ?(all labs ordered are listed, but only abnormal results are displayed) ?Labs  Reviewed - No data to display ? ?EKG ? ? ?Radiology ?DG Forearm Right ? ?Result Date: 02/01/2022 ?CLINICAL DATA:  Weight fell on arm last week with persistent forearm pain. EXAM: RIGHT FOREARM - 2 VIEW COMPARISON:  None. FINDINGS: Cortical irregularity involving the lateral aspect of the distal radial metaphysis. Otherwise, no acute fracture or dislocation. No elbow joint effusion. Limited visualization of the wrist is normal given obliquity on large field of view. IMPRESSION: Cortical irregularity involving the lateral aspect of the distal radial metaphysis may represent sequela of age-indeterminate fracture. Correlation for point tenderness at this location is advised. Further evaluation with dedicated right wrist radiographs could be performed as indicated. Electronically Signed   By: Simonne Come M.D.   On: 02/01/2022 09:07   ? ?Procedures ?Procedures (including critical care time) ? ?Medications Ordered in UC ?Medications  ?acetaminophen (TYLENOL) tablet 650 mg (has no administration in time range)  ? ? ?Initial Impression / Assessment and Plan / UC Course  ?I have reviewed the triage vital signs and the nursing notes. ? ?Pertinent labs & imaging results that were available during my care of the patient were reviewed by me and considered in my medical decision making (see chart for details). ? ?  ?Forearm x-ray today shows possible fracture of the lateral aspect of distal radial metaphysis.  Will place in thumb spica splint and follow up with Orthopedics this week.  Tylenol given in off today for pain.  Note given for work. ?Final Clinical Impressions(s) / UC Diagnoses  ? ?Final diagnoses:  ?Right forearm pain  ?Right wrist pain  ?Injury of right radius  ? ? ? ?Discharge Instructions   ? ?  ?- Forearm x-ray today shows a possible fracture of your right wrist; however this may be an old fracture ?- We have given you Tylenol today and applied a splint to your wrist ?-  You can continue alternating ibuprofen and  Tylenol for the pain ?- Please follow up with an Orthopedic doctor at the number provided below - you need to call to make an appointment ? ? ? ? ?ED Prescriptions   ?None ?  ? ?PDMP not reviewed this encounter. ?  ?Valentino Nose, NP ?02/01/22 [phone] ? ?

## 2022-06-28 ENCOUNTER — Telehealth: Payer: Medicaid Other

## 2022-06-28 ENCOUNTER — Encounter: Payer: Medicaid Other | Admitting: Physician Assistant

## 2022-06-28 NOTE — Progress Notes (Signed)
Scheduled in error. Needed appt for child. This encounter was created in error - please disregard.

## 2022-11-12 IMAGING — CT CT ABD-PELV W/ CM
2 of 4 series · 15 of 46 positions shown, 17 images · IV contrast (omnipaque)
Comparison: None.

CLINICAL DATA: Generalized abdominal pain for several hours

EXAM:
CT ABDOMEN AND PELVIS WITH CONTRAST
TECHNIQUE: Multidetector CT imaging of the abdomen and pelvis was performed
using the standard protocol following bolus administration of
intravenous contrast.
CONTRAST:  100mL OMNIPAQUE IOHEXOL 300 MG/ML  SOLN

[Series 2: axial st · axial · 0.68mm/px · z∈[+1078,+1493]mm · 12 of 93 slices shown, 14 images]
[im 5/93  soft-tissue]
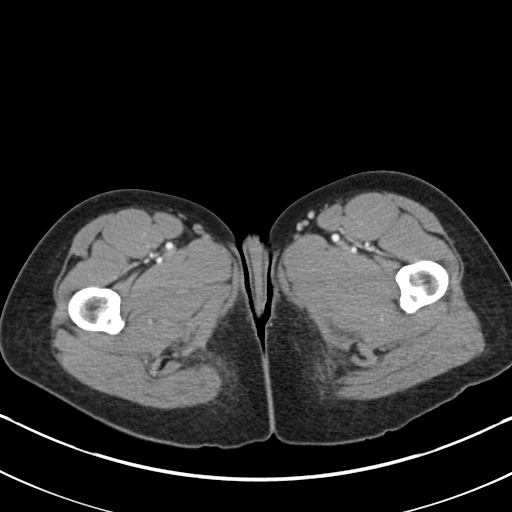
[im 5/93  bone]
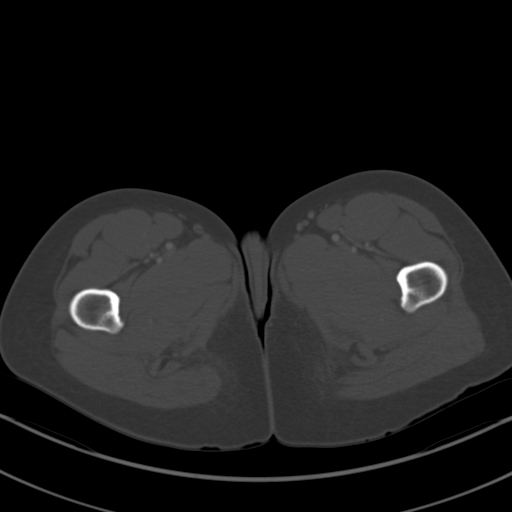
[im 14/93  soft-tissue]
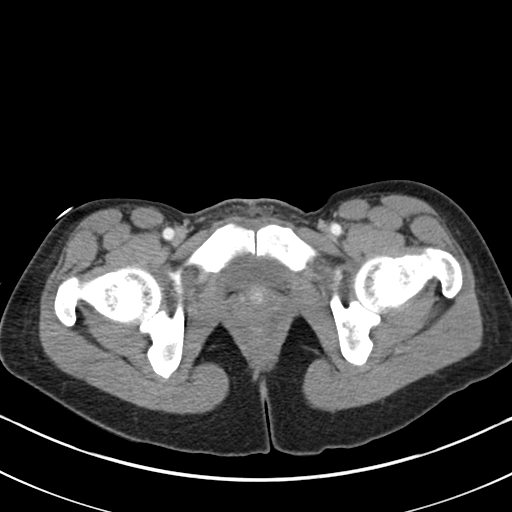
[im 19/93  soft-tissue]
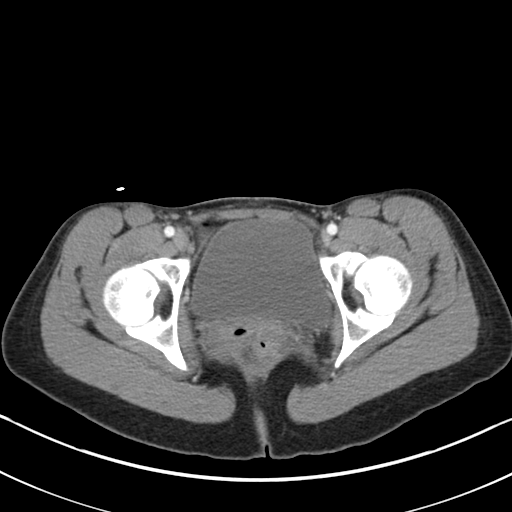
[im 28/93  soft-tissue]
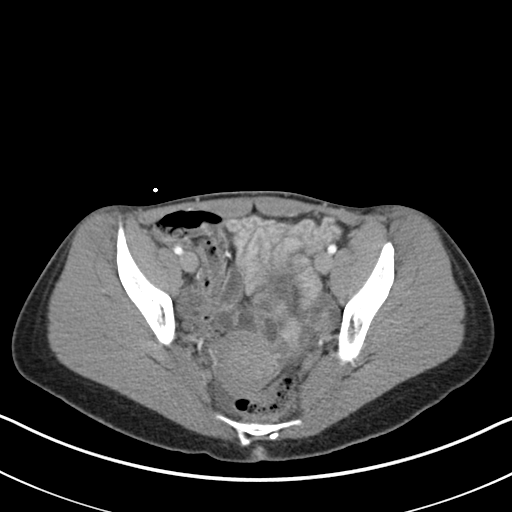
[im 37/93  soft-tissue]
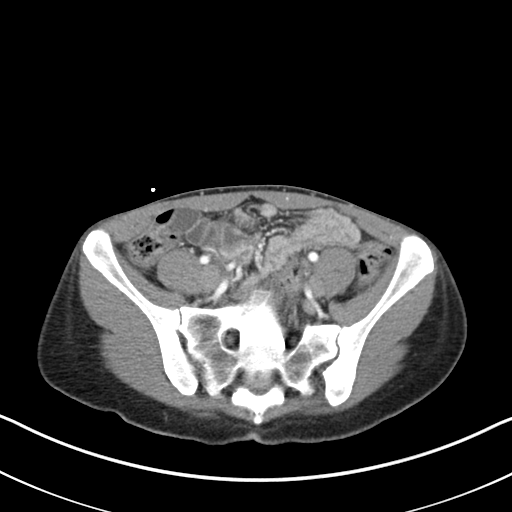
[im 42/93  soft-tissue]
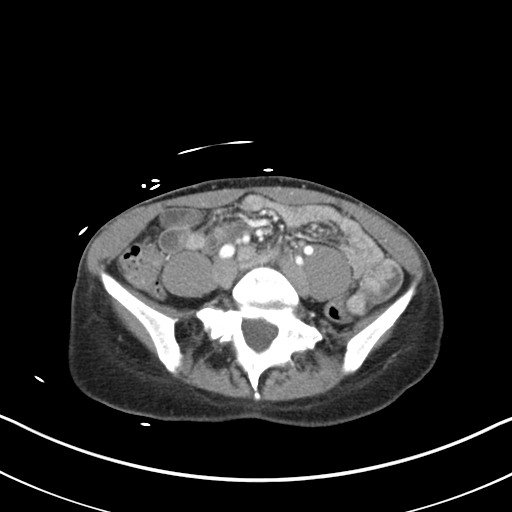
[im 51/93  soft-tissue]
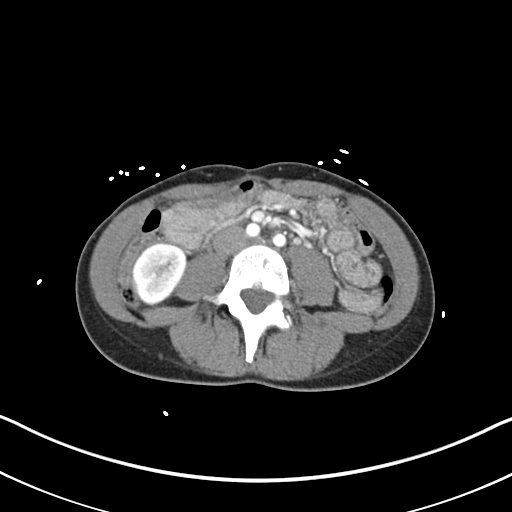
[im 56/93  soft-tissue]
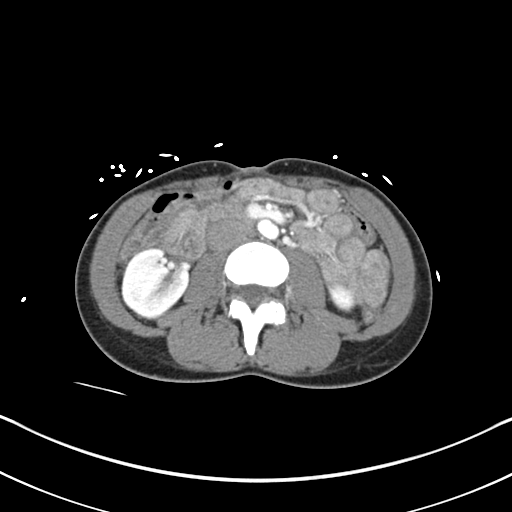
[im 65/93  soft-tissue]
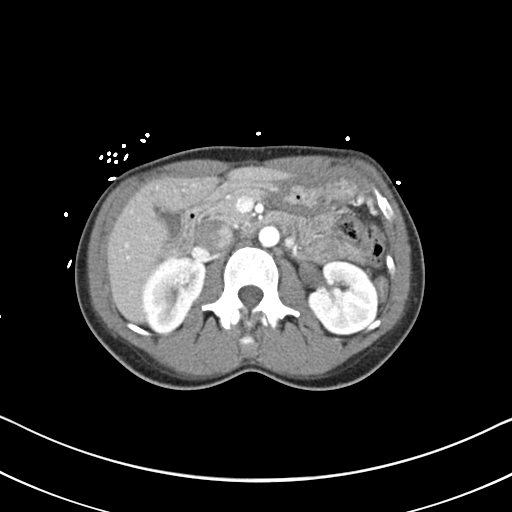
[im 65/93  bone]
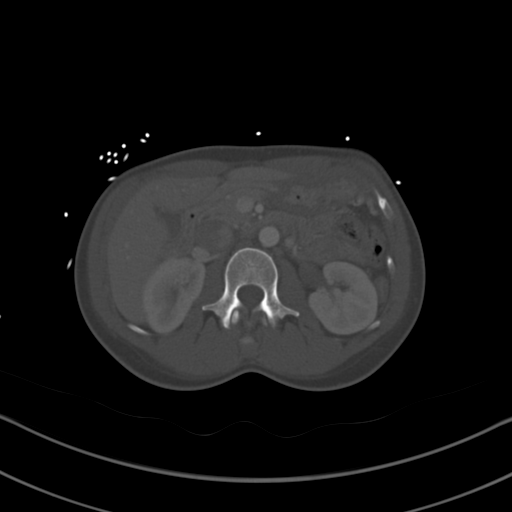
[im 74/93  soft-tissue]
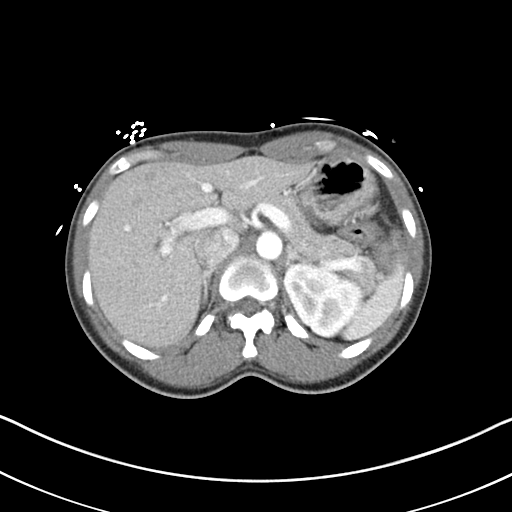
[im 79/93  soft-tissue]
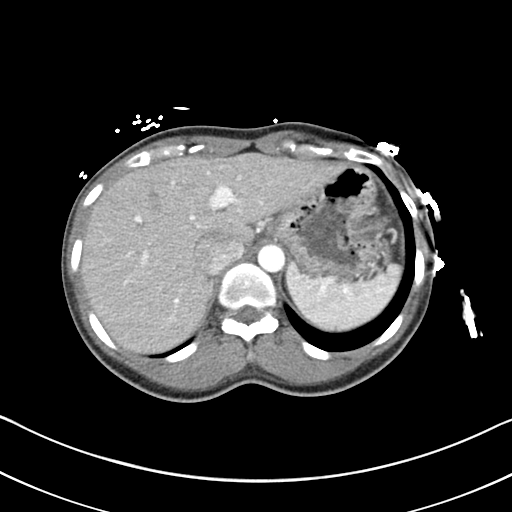
[im 88/93  soft-tissue]
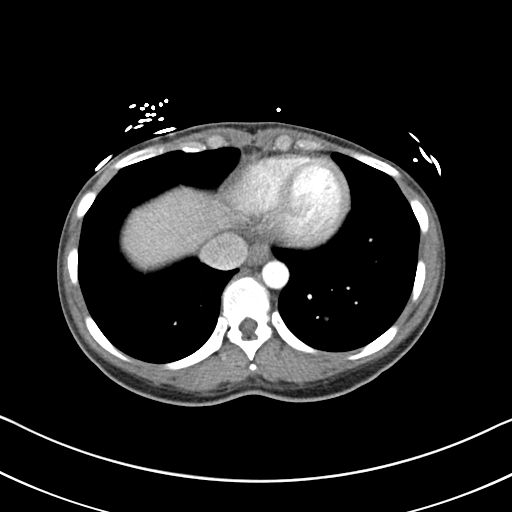

[Series 4: coronal st · coronal · 0.61mm/px · 3 of 104 slices shown]
[im 35/104  soft-tissue]
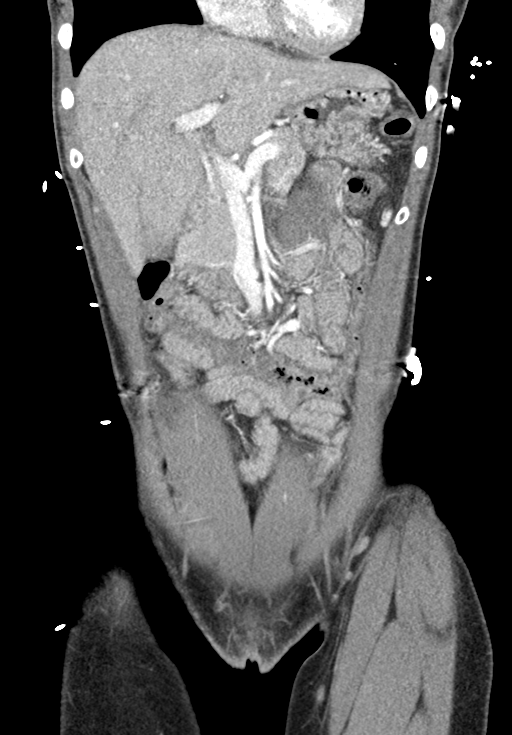
[im 46/104  soft-tissue]
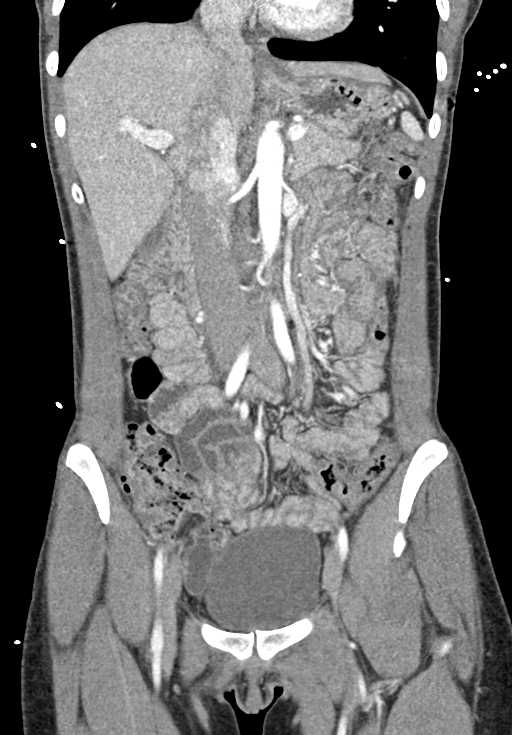
[im 58/104  soft-tissue]
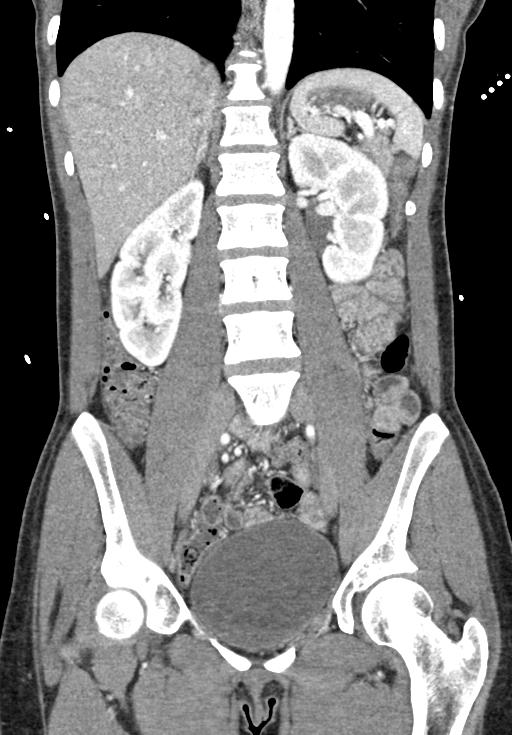

[15 of 46 positions shown; findings below may reference images not displayed]

FINDINGS: Lower chest: Patchy ground-glass opacities are noted in the left
lower lobe. No associated effusion is noted.

Hepatobiliary: No focal liver abnormality is seen. No gallstones,
gallbladder wall thickening, or biliary dilatation.

Pancreas: Unremarkable. No pancreatic ductal dilatation or
surrounding inflammatory changes.

Spleen: Normal in size without focal abnormality.

Adrenals/Urinary Tract: Adrenal glands are within normal limits.
Kidneys demonstrate a normal enhancement pattern bilaterally. No
obstructive changes are seen. No renal or ureteral calculi are
noted. The bladder is well distended.

Stomach/Bowel: Scattered diverticular changes noted without evidence
of diverticulitis. The colon is predominately decompressed. The
appendix is within normal limits. Small bowel and stomach are
unremarkable.

Vascular/Lymphatic: No significant vascular findings are present. No
enlarged abdominal or pelvic lymph nodes.

Reproductive: Uterus and bilateral adnexa are unremarkable.

Other: No abdominal wall hernia or abnormality. No abdominopelvic
ascites.

Musculoskeletal: No acute or significant osseous findings.
IMPRESSION: Diverticulosis without diverticulitis.

Patchy ground-glass Opacities in the left lower lobe likely related
to postinflammatory change. Correlate with 6PBLL-EC testing as
necessary.

No other focal abnormality is seen.

## 2023-09-19 ENCOUNTER — Ambulatory Visit (HOSPITAL_COMMUNITY)
Admission: EM | Admit: 2023-09-19 | Discharge: 2023-09-19 | Disposition: A | Discharge status: Home / Self Care | Payer: Medicaid Other | Attending: Family Medicine | Admitting: Family Medicine

## 2023-09-19 ENCOUNTER — Encounter (HOSPITAL_COMMUNITY): Payer: Self-pay | Admitting: Emergency Medicine

## 2023-09-19 ENCOUNTER — Other Ambulatory Visit: Payer: Self-pay

## 2023-09-19 DIAGNOSIS — M542 Cervicalgia: Secondary | ICD-10-CM

## 2023-09-19 MED ORDER — DEXAMETHASONE SODIUM PHOSPHATE 10 MG/ML IJ SOLN
INTRAMUSCULAR | Status: AC
  Filled 2023-09-19: qty 1

## 2023-09-19 MED ORDER — KETOROLAC TROMETHAMINE 60 MG/2ML IM SOLN
60.0000 mg | Freq: Once | INTRAMUSCULAR | Status: AC
  Administered 2023-09-19: 60 mg via INTRAMUSCULAR

## 2023-09-19 MED ORDER — KETOROLAC TROMETHAMINE 60 MG/2ML IM SOLN
INTRAMUSCULAR | Status: AC
  Filled 2023-09-19: qty 2

## 2023-09-19 MED ORDER — METHOCARBAMOL 500 MG PO TABS: 1000.0000 mg | ORAL_TABLET | Freq: Two times a day (BID) | ORAL | 0 refills | Status: AC

## 2023-09-19 MED ORDER — DEXAMETHASONE SODIUM PHOSPHATE 10 MG/ML IJ SOLN
10.0000 mg | Freq: Once | INTRAMUSCULAR | Status: AC
  Administered 2023-09-19: 10 mg via INTRAMUSCULAR

## 2023-09-19 NOTE — ED Triage Notes (Signed)
Patient has arthritis in neck.  Patient is in the process of moving and working .  No specific injury pre empted this pain episode.  Patient reports she has been told she needs surgery.  Has an appt with spine specialist in February 2025.  Numbness and tingling in left arm has occurred once before.  This episode involves numbness and tingling in left arm. Patient reports she cannot move her neck either -all directions of head movement are limited due to pain.    Patient ran out of muscle relaxers.

## 2023-09-19 NOTE — ED Provider Notes (Addendum)
[facility]   [id number] 09/19/23 Arrival Time: 0949  ASSESSMENT & PLAN:  1. Neck pain   Acute on chronic. Has f/u with surgeon.  Meds ordered this encounter  Medications   ketorolac (TORADOL) injection 60 mg   dexamethasone (DECADRON) injection 10 mg   methocarbamol (ROBAXIN) 500 MG tablet    Sig: Take 2 tablets (1,000 mg total) by mouth 2 (two) times daily.    Dispense:  20 tablet    Refill:  0   Work note provided.   Follow-up Information     [facility].   Specialty: Urgent Care Why: If worsening or failing to improve as anticipated. Contact information: [address] [place] [address] [phone]                Reviewed expectations re: course of current medical issues. Questions answered. Outlined signs and symptoms indicating need for more acute intervention. Patient verbalized understanding. After Visit Summary given.  SUBJECTIVE: History from: patient. Vicki Tran is a 37 y.o. female who presents with acute on chronic neck pain. Patient has arthritis in neck.  Patient is in the process of moving and working. No specific injury pre empted this pain episode. Patient reports she has been told she needs surgery. Has an appt with spine specialist in February 2025. Occasional numbness and tingling in left arm.  Patient ran out of muscle relaxers.    OBJECTIVE:  Vitals:   09/19/23 1124  BP: 126/65  Pulse: 86  Resp: 18  Temp: 98.8 F (37.1 C)  TempSrc: Oral  SpO2: 98%   General appearance: alert; no distress HEENT: normocephalic; atraumatic Neck: supple with FROM but moves slowly; no midline tenderness; does have tenderness of cervical musculature extending over trapezius distribution bilaterally Lungs: clear to auscultation bilaterally; unlabored Extremities: moves all extremities normally; no edema; symmetrical with no gross deformities Neurologic: gait normal; normal sensation and  strength of bilateral UE Psychological: alert and cooperative; normal mood and affect   No Known Allergies Past Medical History:  Diagnosis Date   Depression 2007   Past Surgical History:  Procedure Laterality Date   NO PAST SURGERIES     Family History  Problem Relation Age of Onset   Diabetes Maternal Aunt    Diabetes Paternal Aunt    Social History   Socioeconomic History   Marital status: Divorced    Spouse name: Not on file   Number of children: Not on file   Years of education: Not on file   Highest education level: Not on file  Occupational History   Not on file  Tobacco Use   Smoking status: Every Day    Current packs/day: 0.50    Types: Cigarettes   Smokeless tobacco: Never  Vaping Use   Vaping status: Never Used  Substance and Sexual Activity   Alcohol use: Yes   Drug use: Yes    Types: Marijuana   Sexual activity: Yes    Comment: depo-missed last shot  Other Topics Concern   Not on file  Social History Narrative   Not on file   Social Determinants of Health   Financial Resource Strain: Low Risk  (08/15/2023)   Received from [facility]   Overall Financial Resource Strain (CARDIA)    Difficulty of Paying Living Expenses: Not hard at all  Food Insecurity: No Food Insecurity (08/15/2023)   Received from [facility]   Hunger Vital Sign    Worried About Running Out of  Food in the Last Year: Never true    Ran Out of Food in the Last Year: Never true  Transportation Needs: No Transportation Needs (08/15/2023)   Received from [facility] - Transportation    Lack of Transportation (Medical): No    Lack of Transportation (Non-Medical): No  Physical Activity: Insufficiently Active (08/15/2023)   Received from [facility]   Exercise Vital Sign    Days of Exercise per Week: 5 days    Minutes of Exercise per Session: 20 min  Stress: Stress Concern Present (08/15/2023)   Received from [facility] - Occupational Stress Questionnaire    Feeling of Stress : Very much  Social Connections: Moderately Integrated (08/15/2023)   Received from [facility]   Social Network    How would you rate your social network (family, work, friends)?: Adequate participation with social networks           Mardella Layman, MD 09/19/23 1159    Mardella Layman, MD 09/19/23 1200

## 2023-09-19 NOTE — Discharge Instructions (Addendum)
Meds ordered this encounter  Medications   ketorolac (TORADOL) injection 60 mg   dexamethasone (DECADRON) injection 10 mg   methocarbamol (ROBAXIN) 500 MG tablet    Sig: Take 2 tablets (1,000 mg total) by mouth 2 (two) times daily.    Dispense:  20 tablet    Refill:  0

## 2024-10-03 ENCOUNTER — Telehealth
